# Patient Record
Sex: Female | Born: 1975
Health system: Southern US, Community
[De-identification: ages and names within clinical notes are randomized; demographics above are authoritative.]

## PROBLEM LIST (undated history)

## (undated) ENCOUNTER — Ambulatory Visit (HOSPITAL_BASED_OUTPATIENT_CLINIC_OR_DEPARTMENT_OTHER): Admission: EM | Payer: BC Managed Care – PPO | Source: Home / Self Care

## (undated) DIAGNOSIS — K219 Gastro-esophageal reflux disease without esophagitis: Secondary | ICD-10-CM

## (undated) DIAGNOSIS — E663 Overweight: Secondary | ICD-10-CM

## (undated) DIAGNOSIS — J302 Other seasonal allergic rhinitis: Secondary | ICD-10-CM

## (undated) DIAGNOSIS — R5383 Other fatigue: Secondary | ICD-10-CM

## (undated) DIAGNOSIS — M26629 Arthralgia of temporomandibular joint, unspecified side: Secondary | ICD-10-CM

## (undated) DIAGNOSIS — Z79890 Hormone replacement therapy (postmenopausal): Secondary | ICD-10-CM

## (undated) DIAGNOSIS — R4586 Emotional lability: Secondary | ICD-10-CM

## (undated) DIAGNOSIS — K30 Functional dyspepsia: Secondary | ICD-10-CM

## (undated) DIAGNOSIS — F419 Anxiety disorder, unspecified: Secondary | ICD-10-CM

## (undated) DIAGNOSIS — K5909 Other constipation: Secondary | ICD-10-CM

## (undated) HISTORY — DX: Overweight: E66.3

## (undated) HISTORY — DX: Other fatigue: R53.83

## (undated) HISTORY — DX: Emotional lability: R45.86

## (undated) HISTORY — DX: Gastro-esophageal reflux disease without esophagitis: K21.9

## (undated) HISTORY — DX: Anxiety disorder, unspecified: F41.9

## (undated) HISTORY — DX: Hormone replacement therapy: Z79.890

## (undated) HISTORY — DX: Other constipation: K59.09

## (undated) HISTORY — DX: Other seasonal allergic rhinitis: J30.2

## (undated) HISTORY — DX: Functional dyspepsia: K30

## (undated) HISTORY — DX: Arthralgia of temporomandibular joint, unspecified side: M26.629

## (undated) HISTORY — PX: ABDOMINAL HYSTERECTOMY: SHX81

---

## 1996-06-01 HISTORY — PX: TUBAL LIGATION: SHX77

## 1997-06-01 HISTORY — PX: CYSTECTOMY: SUR359

## 1999-08-20 ENCOUNTER — Encounter: Payer: Self-pay | Admitting: Obstetrics and Gynecology

## 1999-08-20 ENCOUNTER — Encounter: Admission: RE | Admit: 1999-08-20 | Discharge: 1999-08-20 | Payer: Self-pay | Admitting: Obstetrics and Gynecology

## 2002-06-01 HISTORY — PX: CHOLECYSTECTOMY: SHX55

## 2016-06-25 DIAGNOSIS — J0100 Acute maxillary sinusitis, unspecified: Secondary | ICD-10-CM | POA: Diagnosis not present

## 2016-06-29 DIAGNOSIS — J019 Acute sinusitis, unspecified: Secondary | ICD-10-CM | POA: Diagnosis not present

## 2016-06-29 DIAGNOSIS — E663 Overweight: Secondary | ICD-10-CM | POA: Diagnosis not present

## 2016-06-29 DIAGNOSIS — R6889 Other general symptoms and signs: Secondary | ICD-10-CM | POA: Diagnosis not present

## 2016-06-29 DIAGNOSIS — Z6826 Body mass index (BMI) 26.0-26.9, adult: Secondary | ICD-10-CM | POA: Diagnosis not present

## 2016-07-01 DIAGNOSIS — E663 Overweight: Secondary | ICD-10-CM | POA: Diagnosis not present

## 2016-07-01 DIAGNOSIS — Z6826 Body mass index (BMI) 26.0-26.9, adult: Secondary | ICD-10-CM | POA: Diagnosis not present

## 2016-07-01 DIAGNOSIS — J019 Acute sinusitis, unspecified: Secondary | ICD-10-CM | POA: Diagnosis not present

## 2016-07-24 DIAGNOSIS — F411 Generalized anxiety disorder: Secondary | ICD-10-CM | POA: Diagnosis not present

## 2016-07-24 DIAGNOSIS — F3289 Other specified depressive episodes: Secondary | ICD-10-CM | POA: Diagnosis not present

## 2016-07-24 DIAGNOSIS — Z6826 Body mass index (BMI) 26.0-26.9, adult: Secondary | ICD-10-CM | POA: Diagnosis not present

## 2016-07-24 DIAGNOSIS — J302 Other seasonal allergic rhinitis: Secondary | ICD-10-CM | POA: Diagnosis not present

## 2016-07-24 DIAGNOSIS — Z1389 Encounter for screening for other disorder: Secondary | ICD-10-CM | POA: Diagnosis not present

## 2016-08-06 DIAGNOSIS — N939 Abnormal uterine and vaginal bleeding, unspecified: Secondary | ICD-10-CM | POA: Diagnosis not present

## 2016-08-12 DIAGNOSIS — N939 Abnormal uterine and vaginal bleeding, unspecified: Secondary | ICD-10-CM | POA: Diagnosis not present

## 2016-08-12 DIAGNOSIS — Z01419 Encounter for gynecological examination (general) (routine) without abnormal findings: Secondary | ICD-10-CM | POA: Diagnosis not present

## 2016-08-13 DIAGNOSIS — Z01419 Encounter for gynecological examination (general) (routine) without abnormal findings: Secondary | ICD-10-CM | POA: Diagnosis not present

## 2016-08-13 DIAGNOSIS — R875 Abnormal microbiological findings in specimens from female genital organs: Secondary | ICD-10-CM | POA: Diagnosis not present

## 2016-08-27 DIAGNOSIS — N939 Abnormal uterine and vaginal bleeding, unspecified: Secondary | ICD-10-CM | POA: Diagnosis not present

## 2016-09-10 DIAGNOSIS — F411 Generalized anxiety disorder: Secondary | ICD-10-CM | POA: Diagnosis not present

## 2016-09-10 DIAGNOSIS — R1011 Right upper quadrant pain: Secondary | ICD-10-CM | POA: Diagnosis not present

## 2016-09-10 DIAGNOSIS — E663 Overweight: Secondary | ICD-10-CM | POA: Diagnosis not present

## 2016-09-10 DIAGNOSIS — Z6828 Body mass index (BMI) 28.0-28.9, adult: Secondary | ICD-10-CM | POA: Diagnosis not present

## 2016-09-11 DIAGNOSIS — R1011 Right upper quadrant pain: Secondary | ICD-10-CM | POA: Diagnosis not present

## 2016-09-30 DIAGNOSIS — Z6829 Body mass index (BMI) 29.0-29.9, adult: Secondary | ICD-10-CM | POA: Diagnosis not present

## 2016-09-30 DIAGNOSIS — F3289 Other specified depressive episodes: Secondary | ICD-10-CM | POA: Diagnosis not present

## 2016-09-30 DIAGNOSIS — F411 Generalized anxiety disorder: Secondary | ICD-10-CM | POA: Diagnosis not present

## 2016-09-30 DIAGNOSIS — N926 Irregular menstruation, unspecified: Secondary | ICD-10-CM | POA: Diagnosis not present

## 2016-10-15 DIAGNOSIS — N800 Endometriosis of uterus: Secondary | ICD-10-CM | POA: Diagnosis not present

## 2016-10-15 DIAGNOSIS — N939 Abnormal uterine and vaginal bleeding, unspecified: Secondary | ICD-10-CM | POA: Diagnosis not present

## 2016-10-28 DIAGNOSIS — R600 Localized edema: Secondary | ICD-10-CM | POA: Diagnosis not present

## 2016-10-28 DIAGNOSIS — E669 Obesity, unspecified: Secondary | ICD-10-CM | POA: Diagnosis not present

## 2016-10-28 DIAGNOSIS — F411 Generalized anxiety disorder: Secondary | ICD-10-CM | POA: Diagnosis not present

## 2016-10-28 DIAGNOSIS — Z6830 Body mass index (BMI) 30.0-30.9, adult: Secondary | ICD-10-CM | POA: Diagnosis not present

## 2016-10-29 DIAGNOSIS — N939 Abnormal uterine and vaginal bleeding, unspecified: Secondary | ICD-10-CM | POA: Diagnosis not present

## 2016-11-05 DIAGNOSIS — N946 Dysmenorrhea, unspecified: Secondary | ICD-10-CM | POA: Diagnosis not present

## 2016-11-05 DIAGNOSIS — N72 Inflammatory disease of cervix uteri: Secondary | ICD-10-CM | POA: Diagnosis not present

## 2016-11-05 DIAGNOSIS — N800 Endometriosis of uterus: Secondary | ICD-10-CM | POA: Diagnosis not present

## 2016-11-05 DIAGNOSIS — N83291 Other ovarian cyst, right side: Secondary | ICD-10-CM | POA: Diagnosis not present

## 2016-11-05 DIAGNOSIS — D251 Intramural leiomyoma of uterus: Secondary | ICD-10-CM | POA: Diagnosis not present

## 2016-11-05 DIAGNOSIS — N939 Abnormal uterine and vaginal bleeding, unspecified: Secondary | ICD-10-CM | POA: Diagnosis not present

## 2016-11-06 DIAGNOSIS — N800 Endometriosis of uterus: Secondary | ICD-10-CM | POA: Diagnosis not present

## 2016-11-06 DIAGNOSIS — N939 Abnormal uterine and vaginal bleeding, unspecified: Secondary | ICD-10-CM | POA: Diagnosis not present

## 2016-11-06 DIAGNOSIS — N946 Dysmenorrhea, unspecified: Secondary | ICD-10-CM | POA: Diagnosis not present

## 2016-11-06 DIAGNOSIS — D251 Intramural leiomyoma of uterus: Secondary | ICD-10-CM | POA: Diagnosis not present

## 2016-11-06 DIAGNOSIS — N72 Inflammatory disease of cervix uteri: Secondary | ICD-10-CM | POA: Diagnosis not present

## 2016-11-06 DIAGNOSIS — N83291 Other ovarian cyst, right side: Secondary | ICD-10-CM | POA: Diagnosis not present

## 2016-11-09 DIAGNOSIS — R197 Diarrhea, unspecified: Secondary | ICD-10-CM | POA: Diagnosis not present

## 2016-11-09 DIAGNOSIS — R109 Unspecified abdominal pain: Secondary | ICD-10-CM | POA: Diagnosis not present

## 2016-11-09 DIAGNOSIS — G8918 Other acute postprocedural pain: Secondary | ICD-10-CM | POA: Diagnosis not present

## 2016-11-12 DIAGNOSIS — R1030 Lower abdominal pain, unspecified: Secondary | ICD-10-CM | POA: Diagnosis not present

## 2016-11-12 DIAGNOSIS — Z90710 Acquired absence of both cervix and uterus: Secondary | ICD-10-CM | POA: Diagnosis not present

## 2016-11-12 DIAGNOSIS — G8918 Other acute postprocedural pain: Secondary | ICD-10-CM | POA: Diagnosis not present

## 2016-12-30 DIAGNOSIS — Z6832 Body mass index (BMI) 32.0-32.9, adult: Secondary | ICD-10-CM | POA: Diagnosis not present

## 2016-12-30 DIAGNOSIS — J019 Acute sinusitis, unspecified: Secondary | ICD-10-CM | POA: Diagnosis not present

## 2016-12-30 DIAGNOSIS — R635 Abnormal weight gain: Secondary | ICD-10-CM | POA: Diagnosis not present

## 2017-02-03 DIAGNOSIS — E663 Overweight: Secondary | ICD-10-CM | POA: Diagnosis not present

## 2017-02-03 DIAGNOSIS — R635 Abnormal weight gain: Secondary | ICD-10-CM | POA: Diagnosis not present

## 2017-02-03 DIAGNOSIS — J019 Acute sinusitis, unspecified: Secondary | ICD-10-CM | POA: Diagnosis not present

## 2017-02-03 DIAGNOSIS — Z6829 Body mass index (BMI) 29.0-29.9, adult: Secondary | ICD-10-CM | POA: Diagnosis not present

## 2017-02-20 DIAGNOSIS — J019 Acute sinusitis, unspecified: Secondary | ICD-10-CM | POA: Diagnosis not present

## 2017-02-20 DIAGNOSIS — R6884 Jaw pain: Secondary | ICD-10-CM | POA: Diagnosis not present

## 2017-03-03 DIAGNOSIS — R635 Abnormal weight gain: Secondary | ICD-10-CM | POA: Diagnosis not present

## 2017-03-03 DIAGNOSIS — Z6826 Body mass index (BMI) 26.0-26.9, adult: Secondary | ICD-10-CM | POA: Diagnosis not present

## 2017-03-30 DIAGNOSIS — J0100 Acute maxillary sinusitis, unspecified: Secondary | ICD-10-CM | POA: Diagnosis not present

## 2017-03-30 DIAGNOSIS — J209 Acute bronchitis, unspecified: Secondary | ICD-10-CM | POA: Diagnosis not present

## 2017-04-02 DIAGNOSIS — E663 Overweight: Secondary | ICD-10-CM | POA: Diagnosis not present

## 2017-04-02 DIAGNOSIS — R635 Abnormal weight gain: Secondary | ICD-10-CM | POA: Diagnosis not present

## 2017-04-02 DIAGNOSIS — D509 Iron deficiency anemia, unspecified: Secondary | ICD-10-CM | POA: Diagnosis not present

## 2017-04-02 DIAGNOSIS — R0989 Other specified symptoms and signs involving the circulatory and respiratory systems: Secondary | ICD-10-CM | POA: Diagnosis not present

## 2017-07-01 DIAGNOSIS — J0100 Acute maxillary sinusitis, unspecified: Secondary | ICD-10-CM | POA: Diagnosis not present

## 2017-11-04 DIAGNOSIS — J0100 Acute maxillary sinusitis, unspecified: Secondary | ICD-10-CM | POA: Diagnosis not present

## 2017-11-08 DIAGNOSIS — Z6826 Body mass index (BMI) 26.0-26.9, adult: Secondary | ICD-10-CM | POA: Diagnosis not present

## 2017-11-08 DIAGNOSIS — R1011 Right upper quadrant pain: Secondary | ICD-10-CM | POA: Diagnosis not present

## 2017-11-12 DIAGNOSIS — R1011 Right upper quadrant pain: Secondary | ICD-10-CM | POA: Diagnosis not present

## 2017-11-17 DIAGNOSIS — D729 Disorder of white blood cells, unspecified: Secondary | ICD-10-CM | POA: Diagnosis not present

## 2017-11-17 DIAGNOSIS — F411 Generalized anxiety disorder: Secondary | ICD-10-CM | POA: Diagnosis not present

## 2017-11-17 DIAGNOSIS — Z6827 Body mass index (BMI) 27.0-27.9, adult: Secondary | ICD-10-CM | POA: Diagnosis not present

## 2017-12-11 DIAGNOSIS — J309 Allergic rhinitis, unspecified: Secondary | ICD-10-CM | POA: Diagnosis not present

## 2018-02-04 DIAGNOSIS — J019 Acute sinusitis, unspecified: Secondary | ICD-10-CM | POA: Diagnosis not present

## 2018-02-04 DIAGNOSIS — M1710 Unilateral primary osteoarthritis, unspecified knee: Secondary | ICD-10-CM | POA: Diagnosis not present

## 2018-02-04 DIAGNOSIS — Z6825 Body mass index (BMI) 25.0-25.9, adult: Secondary | ICD-10-CM | POA: Diagnosis not present

## 2018-03-07 DIAGNOSIS — J0100 Acute maxillary sinusitis, unspecified: Secondary | ICD-10-CM | POA: Diagnosis not present

## 2018-07-05 DIAGNOSIS — S46911A Strain of unspecified muscle, fascia and tendon at shoulder and upper arm level, right arm, initial encounter: Secondary | ICD-10-CM | POA: Diagnosis not present

## 2018-07-05 DIAGNOSIS — F411 Generalized anxiety disorder: Secondary | ICD-10-CM | POA: Diagnosis not present

## 2018-07-05 DIAGNOSIS — Z6831 Body mass index (BMI) 31.0-31.9, adult: Secondary | ICD-10-CM | POA: Diagnosis not present

## 2018-07-05 DIAGNOSIS — J019 Acute sinusitis, unspecified: Secondary | ICD-10-CM | POA: Diagnosis not present

## 2018-08-11 DIAGNOSIS — L309 Dermatitis, unspecified: Secondary | ICD-10-CM | POA: Diagnosis not present

## 2018-08-11 DIAGNOSIS — J019 Acute sinusitis, unspecified: Secondary | ICD-10-CM | POA: Diagnosis not present

## 2018-08-11 DIAGNOSIS — R0982 Postnasal drip: Secondary | ICD-10-CM | POA: Diagnosis not present

## 2018-08-16 DIAGNOSIS — R0602 Shortness of breath: Secondary | ICD-10-CM | POA: Diagnosis not present

## 2018-08-16 DIAGNOSIS — J019 Acute sinusitis, unspecified: Secondary | ICD-10-CM | POA: Diagnosis not present

## 2018-08-16 DIAGNOSIS — R51 Headache: Secondary | ICD-10-CM | POA: Diagnosis not present

## 2018-10-07 DIAGNOSIS — G4700 Insomnia, unspecified: Secondary | ICD-10-CM | POA: Diagnosis not present

## 2018-10-07 DIAGNOSIS — J019 Acute sinusitis, unspecified: Secondary | ICD-10-CM | POA: Diagnosis not present

## 2018-10-07 DIAGNOSIS — N3000 Acute cystitis without hematuria: Secondary | ICD-10-CM | POA: Diagnosis not present

## 2018-12-03 DIAGNOSIS — Z79899 Other long term (current) drug therapy: Secondary | ICD-10-CM | POA: Diagnosis not present

## 2018-12-03 DIAGNOSIS — R109 Unspecified abdominal pain: Secondary | ICD-10-CM | POA: Diagnosis not present

## 2018-12-03 DIAGNOSIS — N390 Urinary tract infection, site not specified: Secondary | ICD-10-CM | POA: Diagnosis not present

## 2018-12-03 DIAGNOSIS — Z79891 Long term (current) use of opiate analgesic: Secondary | ICD-10-CM | POA: Diagnosis not present

## 2018-12-03 DIAGNOSIS — N3001 Acute cystitis with hematuria: Secondary | ICD-10-CM | POA: Diagnosis not present

## 2018-12-03 DIAGNOSIS — B9689 Other specified bacterial agents as the cause of diseases classified elsewhere: Secondary | ICD-10-CM | POA: Diagnosis not present

## 2018-12-07 DIAGNOSIS — K5909 Other constipation: Secondary | ICD-10-CM | POA: Diagnosis not present

## 2018-12-07 DIAGNOSIS — R635 Abnormal weight gain: Secondary | ICD-10-CM | POA: Diagnosis not present

## 2018-12-07 DIAGNOSIS — Z6830 Body mass index (BMI) 30.0-30.9, adult: Secondary | ICD-10-CM | POA: Diagnosis not present

## 2019-01-11 DIAGNOSIS — R635 Abnormal weight gain: Secondary | ICD-10-CM | POA: Diagnosis not present

## 2019-01-11 DIAGNOSIS — Z6830 Body mass index (BMI) 30.0-30.9, adult: Secondary | ICD-10-CM | POA: Diagnosis not present

## 2019-01-11 DIAGNOSIS — F411 Generalized anxiety disorder: Secondary | ICD-10-CM | POA: Diagnosis not present

## 2019-01-17 DIAGNOSIS — Z20828 Contact with and (suspected) exposure to other viral communicable diseases: Secondary | ICD-10-CM | POA: Diagnosis not present

## 2019-01-25 DIAGNOSIS — L259 Unspecified contact dermatitis, unspecified cause: Secondary | ICD-10-CM | POA: Diagnosis not present

## 2019-01-25 DIAGNOSIS — Z6829 Body mass index (BMI) 29.0-29.9, adult: Secondary | ICD-10-CM | POA: Diagnosis not present

## 2019-01-26 DIAGNOSIS — L259 Unspecified contact dermatitis, unspecified cause: Secondary | ICD-10-CM | POA: Diagnosis not present

## 2019-01-26 DIAGNOSIS — Z6830 Body mass index (BMI) 30.0-30.9, adult: Secondary | ICD-10-CM | POA: Diagnosis not present

## 2019-01-26 DIAGNOSIS — R102 Pelvic and perineal pain: Secondary | ICD-10-CM | POA: Diagnosis not present

## 2019-02-07 DIAGNOSIS — R3989 Other symptoms and signs involving the genitourinary system: Secondary | ICD-10-CM | POA: Diagnosis not present

## 2019-02-07 DIAGNOSIS — Z6830 Body mass index (BMI) 30.0-30.9, adult: Secondary | ICD-10-CM | POA: Diagnosis not present

## 2019-02-07 DIAGNOSIS — R635 Abnormal weight gain: Secondary | ICD-10-CM | POA: Diagnosis not present

## 2019-02-07 DIAGNOSIS — N959 Unspecified menopausal and perimenopausal disorder: Secondary | ICD-10-CM | POA: Diagnosis not present

## 2019-03-09 DIAGNOSIS — R635 Abnormal weight gain: Secondary | ICD-10-CM | POA: Diagnosis not present

## 2019-03-09 DIAGNOSIS — Z6830 Body mass index (BMI) 30.0-30.9, adult: Secondary | ICD-10-CM | POA: Diagnosis not present

## 2019-03-23 DIAGNOSIS — J019 Acute sinusitis, unspecified: Secondary | ICD-10-CM | POA: Diagnosis not present

## 2019-04-03 DIAGNOSIS — Z6830 Body mass index (BMI) 30.0-30.9, adult: Secondary | ICD-10-CM | POA: Diagnosis not present

## 2019-04-03 DIAGNOSIS — N390 Urinary tract infection, site not specified: Secondary | ICD-10-CM | POA: Diagnosis not present

## 2019-04-12 DIAGNOSIS — Z79899 Other long term (current) drug therapy: Secondary | ICD-10-CM | POA: Diagnosis not present

## 2019-04-12 DIAGNOSIS — R1032 Left lower quadrant pain: Secondary | ICD-10-CM | POA: Diagnosis not present

## 2019-04-12 DIAGNOSIS — R3915 Urgency of urination: Secondary | ICD-10-CM | POA: Diagnosis not present

## 2019-04-12 DIAGNOSIS — N390 Urinary tract infection, site not specified: Secondary | ICD-10-CM | POA: Diagnosis not present

## 2019-04-12 DIAGNOSIS — R310 Gross hematuria: Secondary | ICD-10-CM | POA: Diagnosis not present

## 2019-05-12 DIAGNOSIS — J019 Acute sinusitis, unspecified: Secondary | ICD-10-CM | POA: Diagnosis not present

## 2019-05-12 DIAGNOSIS — Z79899 Other long term (current) drug therapy: Secondary | ICD-10-CM | POA: Diagnosis not present

## 2020-11-08 ENCOUNTER — Encounter: Payer: Self-pay | Admitting: Cardiology

## 2020-11-11 ENCOUNTER — Encounter: Payer: Self-pay | Admitting: Cardiology

## 2020-11-11 ENCOUNTER — Ambulatory Visit: Payer: 59 | Admitting: Cardiology

## 2020-11-11 ENCOUNTER — Other Ambulatory Visit: Payer: Self-pay

## 2020-11-11 VITALS — BP 140/92 | HR 88 | Ht 61.0 in | Wt 185.0 lb

## 2020-11-11 DIAGNOSIS — R9431 Abnormal electrocardiogram [ECG] [EKG]: Secondary | ICD-10-CM | POA: Diagnosis not present

## 2020-11-11 DIAGNOSIS — R0602 Shortness of breath: Secondary | ICD-10-CM

## 2020-11-11 DIAGNOSIS — R0789 Other chest pain: Secondary | ICD-10-CM

## 2020-11-11 DIAGNOSIS — R400 Somnolence: Secondary | ICD-10-CM

## 2020-11-11 DIAGNOSIS — R5383 Other fatigue: Secondary | ICD-10-CM | POA: Diagnosis not present

## 2020-11-11 DIAGNOSIS — I872 Venous insufficiency (chronic) (peripheral): Secondary | ICD-10-CM

## 2020-11-11 DIAGNOSIS — R0683 Snoring: Secondary | ICD-10-CM

## 2020-11-11 DIAGNOSIS — Z720 Tobacco use: Secondary | ICD-10-CM

## 2020-11-11 NOTE — Patient Instructions (Addendum)
Medication Instructions:  Your physician recommends that you continue on your current medications as directed. Please refer to the Current Medication list given to you today.  *If you need a refill on your cardiac medications before your next appointment, please call your pharmacy*   Lab Work: None If you have labs (blood work) drawn today and your tests are completely normal, you will receive your results only by: MyChart Message (if you have MyChart) OR A paper copy in the mail If you have any lab test that is abnormal or we need to change your treatment, we will call you to review the results.   Testing/Procedures: Your physician has requested that you have an echocardiogram. Echocardiography is a painless test that uses sound waves to create images of your heart. It provides your doctor with information about the size and shape of your heart and how well your heart's chambers and valves are working. This procedure takes approximately one hour. There are no restrictions for this procedure.  Your physician has requested that you have an exercise stress myoview. For further information please visit [email or web address] Please follow instruction sheet, as given.    Follow-Up: At [facility], you and your health needs are our priority.  As part of our continuing mission to provide you with exceptional heart care, we have created designated Provider Care Teams.  These Care Teams include your primary Cardiologist (physician) and Advanced Practice Providers (APPs -  Physician Assistants and Nurse Practitioners) who all work together to provide you with the care you need, when you need it.  We recommend signing up for the patient portal called "MyChart".  Sign up information is provided on this After Visit Summary.  MyChart is used to connect with patients for Virtual Visits (Telemedicine).  Patients are able to view lab/test results, encounter notes, upcoming appointments, etc.  Non-urgent  messages can be sent to your provider as well.   To learn more about what you can do with MyChart, go to [email or web address].    Your next appointment:   12 week(s)  The format for your next appointment:   In Person  Provider:      Other Instructions   [facility] [address] [place], [place] [address] Phone:  [phone]  Please arrive 15 minutes prior to your appointment time for registration and insurance purposes.  The test will take approximately 3 to 4 hours to complete; you may bring reading material.  If someone comes with you to your appointment, they will need to remain in the main lobby due to limited space in the testing area. **If you are pregnant or breastfeeding, please notify the nuclear lab prior to your appointment**  How to prepare for your Myocardial Perfusion Test: Do not eat or drink 3 hours prior to your test, except you may have water. Do not consume products containing caffeine (regular or decaffeinated) 12 hours prior to your test. (ex: coffee, chocolate, sodas, tea). Do wear comfortable clothes (no dresses or overalls) and walking shoes, tennis shoes preferred (No heels or open toe shoes are allowed). Do NOT wear cologne, perfume, aftershave, or lotions (deodorant is allowed). If these instructions are not followed, your test will have to be rescheduled.  Please report to [address] for your test.  If you have questions or concerns about your appointment, you can call the [facility] at [phone].  If you cannot keep your appointment, please provide 24 hours notification to the  Nuclear Lab, to avoid a possible $50 charge to your account.  Echocardiogram An echocardiogram is a test that uses sound waves (ultrasound) to produce images of the heart. Images from an echocardiogram can provide important information about: Heart size and shape. The size and thickness and movement  of your heart's walls. Heart muscle function and strength. Heart valve function or if you have stenosis. Stenosis is when the heart valves are too narrow. If blood is flowing backward through the heart valves (regurgitation). A tumor or infectious growth around the heart valves. Areas of heart muscle that are not working well because of poor blood flow or injury from a heart attack. Aneurysm detection. An aneurysm is a weak or damaged part of an artery wall. The wall bulges out from the normal force of blood pumping through the body. Tell a health care provider about: Any allergies you have. All medicines you are taking, including vitamins, herbs, eye drops, creams, and over-the-counter medicines. Any blood disorders you have. Any surgeries you have had. Any medical conditions you have. Whether you are pregnant or may be pregnant. What are the risks? Generally, this is a safe test. However, problems may occur, including an allergic reaction to dye (contrast) that may be used during the test. What happens before the test? No specific preparation is needed. You may eat and drink normally. What happens during the test?  You will take off your clothes from the waist up and put on a hospital gown. Electrodes or electrocardiogram (ECG)patches may be placed on your chest. The electrodes or patches are then connected to a device that monitors your heart rate and rhythm. You will lie down on a table for an ultrasound exam. A gel will be applied to your chest to help sound waves pass through your skin. A handheld device, called a transducer, will be pressed against your chest and moved over your heart. The transducer produces sound waves that travel to your heart and bounce back (or "echo" back) to the transducer. These sound waves will be captured in real-time and changed into images of your heart that can be viewed on a video monitor. The images will be recorded on a computer and reviewed by your  health care provider. You may be asked to change positions or hold your breath for a short time. This makes it easier to get different views or better views of your heart. In some cases, you may receive contrast through an IV in one of your veins. This can improve the quality of the pictures from your heart. The procedure may vary among health care providers and hospitals. What can I expect after the test? You may return to your normal, everyday life, including diet, activities, andmedicines, unless your health care provider tells you not to do that. Follow these instructions at home: It is up to you to get the results of your test. Ask your health care provider, or the department that is doing the test, when your results will be ready. Keep all follow-up visits. This is important. Summary An echocardiogram is a test that uses sound waves (ultrasound) to produce images of the heart. Images from an echocardiogram can provide important information about the size and shape of your heart, heart muscle function, heart valve function, and other possible heart problems. You do not need to do anything to prepare before this test. You may eat and drink normally. After the echocardiogram is completed, you may return to your normal, everyday life, unless your health  care provider tells you not to do that. This information is not intended to replace advice given to you by your health care provider. Make sure you discuss any questions you have with your healthcare provider. Document Revised: 01/09/2020 Document Reviewed: 01/09/2020 Elsevier Patient Education  2022 [employer].

## 2020-11-11 NOTE — Progress Notes (Signed)
Cardiology Office Note:    Date:  11/11/2020   ID:  Elizabeth Montoya, DOB 1976-03-24, MRN [id number]  PCP:  Lucianne Lei, MD  Cardiologist:  Thomasene Ripple, DO  Electrophysiologist:  None   Referring MD: Lucianne Lei, MD   No chief complaint on file. " I am having shortness of breath and fatigue"   History of Present Illness:    Elizabeth Montoya is a 45 y.o. female with a hx of obesity, anxiety who is here today to be evaluated for shortness of breath and fatigue.  Patient tells me the last several months she has had significant shortness of breath on exertion.  She notes that with that she is have been having some fatigue.  In the last couple weeks she has had intermittent chest discomfort.  She describes it as a very soft sensation of midsternal chest pain.  She notes that the shortness of breath actually is the biggest problem on her fall that has been coupled with her fatigue She also tells me that only she fatigue symptoms during the day she feels as if she is going to fall asleep and cannot do all of her work. 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  She is concerned   Past Medical History:  Diagnosis Date   Anxiety    Constipation, chronic    Fatigue    Hormone replacement therapy    Mood swings    Overweight    Persistent indigestion    Seasonal allergies    TMJ arthralgia     Past Surgical History:  Procedure Laterality Date   ABDOMINAL HYSTERECTOMY     CESAREAN SECTION  1997   CHOLECYSTECTOMY  2004   CYSTECTOMY  1999   TUBAL LIGATION  1998    Current Medications: No outpatient medications have been marked as taking for the 11/11/20 encounter (Office Visit) with Thomasene Ripple, DO.     Allergies:   Codeine   Social History   Socioeconomic History   Marital status: Single    Spouse name: Not on file   Number of children: Not on file   Years of education: Not on file    Highest education level: Not on file  Occupational History   Not on file  Tobacco Use   Smoking status: Former    Pack years: 0.00    Types: Cigarettes   Smokeless tobacco: Not on file  Substance and Sexual Activity   Alcohol use: Never   Drug use: Never   Sexual activity: Not on file  Other Topics Concern   Not on file  Social History Narrative   Not on file   Social Determinants of Health   Financial Resource Strain: Not on file  Food Insecurity: Not on file  Transportation Needs: Not on file  Physical Activity: Not on file  Stress: Not on file  Social Connections: Not on file     Family History: The patient's family history includes Anemia in her father; Arthritis in her father; Asthma in her father and mother; Cancer in her father; Colon cancer in her paternal grandmother; Heart disease in her father and paternal grandmother; Heart failure in her mother; Hypercholesterolemia in her father and paternal grandmother; Hypertension in her father; Prostate cancer in her paternal grandfather; Seizures in her mother; Stroke in her father; Thyroid disease in her mother.  ROS:   Review of Systems  Constitution: Negative for decreased appetite, fever and weight gain.  HENT: Negative for congestion, ear discharge, hoarse voice and sore throat.   Eyes: Negative for discharge, redness, vision loss in right eye and visual halos.  Cardiovascular: Negative for chest pain, dyspnea on exertion, leg swelling, orthopnea and palpitations.  Respiratory: Negative for cough, hemoptysis, shortness of breath and snoring.   Endocrine: Negative for heat intolerance and polyphagia.  Hematologic/Lymphatic: Negative for bleeding problem. Does not bruise/bleed easily.  Skin: Negative for flushing, nail changes, rash and suspicious lesions.  Musculoskeletal: Negative for arthritis, joint pain, muscle cramps, myalgias, neck pain and stiffness.  Gastrointestinal: Negative for abdominal pain, bowel  incontinence, diarrhea and excessive appetite.  Genitourinary: Negative for decreased libido, genital sores and incomplete emptying.  Neurological: Negative for brief paralysis, focal weakness, headaches and loss of balance.  Psychiatric/Behavioral: Negative for altered mental status, depression and suicidal ideas.  Allergic/Immunologic: Negative for HIV exposure and persistent infections.    EKGs/Labs/Other Studies Reviewed:    The following studies were reviewed today:   EKG:  The ekg ordered today demonstrates sinus rhythm, heart rate 88 bpm with poor progression in the anterior leads suggesting old anterior.  Recent Labs: No results found for requested labs within last 8760 hours.  Recent Lipid Panel No results found for: CHOL, TRIG, HDL, CHOLHDL, VLDL, LDLCALC, LDLDIRECT  Physical Exam:    VS:  BP (!) 140/92 (BP Location: Right Arm)   Pulse 88   Ht 5\' 1"  (1.549 m)   Wt 185 lb (83.9 kg)   SpO2 98%   BMI 34.96 kg/m     Wt Readings from Last 3 Encounters:  11/11/20 185 lb (83.9 kg)  11/07/20 185 lb (83.9 kg)     GEN: Well nourished, well developed in no acute distress HEENT: Normal NECK: No JVD; No carotid bruits LYMPHATICS: No lymphadenopathy CARDIAC: S1S2 noted,RRR, no murmurs, rubs, gallops RESPIRATORY:  Clear to auscultation without rales, wheezing or rhonchi  ABDOMEN: Soft, non-tender, non-distended, +bowel sounds, no guarding. EXTREMITIES: No edema, No cyanosis, no clubbing MUSCULOSKELETAL:  No deformity  SKIN: Warm and dry NEUROLOGIC:  Alert and oriented x 3, non-focal PSYCHIATRIC:  Normal affect, good insight  ASSESSMENT:    1. SOB (shortness of breath)   2. Other chest pain   3. Fatigue, unspecified type   4. Nonspecific abnormal electrocardiogram (ECG) (EKG)   5. Snoring   6. Daytime somnolence   7. Tobacco use   8. Venous insufficiency    PLAN:     Her shortness of breath along with her intermittent chest pain and abnormal EKG is concerning  therefore I will proceed with an ischemic evaluation in this patient.  She is agreeable for exercise nuclear stress test therefore we will proceed with this.  I have educated patient about this test and she is agreeable to proceed.  In the meantime for completeness an echocardiogram will be ordered to assess for LV function and any other structural abnormalities.  I think sleep apnea may be playing a role, I reviewed her PCP notes in her PCP have ordered a sleep study so I encouraged the patient to follow through with this.  The patient understands the need to lose weight with diet and exercise. We have discussed specific strategies for this.  She is hypertensive in the office today going back to her last visit on November 08, 2018 with her PCP at which time she was 128/76.  This is an isolated reading.  Advised the patient to take  her blood pressure every day for the next 2 weeks and send this information to my office.  Smoking cessation advised.  The patient is in agreement with the above plan. The patient left the office in stable condition.  The patient will follow up in 12 weeks or sooner if needed.   Medication Adjustments/Labs and Tests Ordered: Current medicines are reviewed at length with the patient today.  Concerns regarding medicines are outlined above.  Orders Placed This Encounter  Procedures   MYOCARDIAL PERFUSION IMAGING   EKG 12-Lead   ECHOCARDIOGRAM COMPLETE   No orders of the defined types were placed in this encounter.   Patient Instructions  Medication Instructions:  Your physician recommends that you continue on your current medications as directed. Please refer to the Current Medication list given to you today.  *If you need a refill on your cardiac medications before your next appointment, please call your pharmacy*   Lab Work: None If you have labs (blood work) drawn today and your tests are completely normal, you will receive your results only by: MyChart  Message (if you have MyChart) OR A paper copy in the mail If you have any lab test that is abnormal or we need to change your treatment, we will call you to review the results.   Testing/Procedures: Your physician has requested that you have an echocardiogram. Echocardiography is a painless test that uses sound waves to create images of your heart. It provides your doctor with information about the size and shape of your heart and how well your heart's chambers and valves are working. This procedure takes approximately one hour. There are no restrictions for this procedure.  Your physician has requested that you have an exercise stress myoview. For further information please visit [email or web address]. Please follow instruction sheet, as given.    Follow-Up: At [facility], you and your health needs are our priority.  As part of our continuing mission to provide you with exceptional heart care, we have created designated Provider Care Teams.  These Care Teams include your primary Cardiologist (physician) and Advanced Practice Providers (APPs -  Physician Assistants and Nurse Practitioners) who all work together to provide you with the care you need, when you need it.  We recommend signing up for the patient portal called "MyChart".  Sign up information is provided on this After Visit Summary.  MyChart is used to connect with patients for Virtual Visits (Telemedicine).  Patients are able to view lab/test results, encounter notes, upcoming appointments, etc.  Non-urgent messages can be sent to your provider as well.   To learn more about what you can do with MyChart, go to [email or web address].    Your next appointment:   12 week(s)  The format for your next appointment:   In Person  Provider:      Other Instructions   [facility]  Nuclear Imaging [address] [place], [place] [address] Phone:  [phone]  Please arrive 15 minutes prior to your appointment time for  registration and insurance purposes.  The test will take approximately 3 to 4 hours to complete; you may bring reading material.  If someone comes with you to your appointment, they will need to remain in the main lobby due to limited space in the testing area. **If you are pregnant or breastfeeding, please notify the nuclear lab prior to your appointment**  How to prepare for your Myocardial Perfusion Test: Do not eat or drink 3 hours prior to your test, except you may  have water. Do not consume products containing caffeine (regular or decaffeinated) 12 hours prior to your test. (ex: coffee, chocolate, sodas, tea). Do wear comfortable clothes (no dresses or overalls) and walking shoes, tennis shoes preferred (No heels or open toe shoes are allowed). Do NOT wear cologne, perfume, aftershave, or lotions (deodorant is allowed). If these instructions are not followed, your test will have to be rescheduled.  Please report to [address] for your test.  If you have questions or concerns about your appointment, you can call the [facility] Lab at [phone].  If you cannot keep your appointment, please provide 24 hours notification to the Nuclear Lab, to avoid a possible $50 charge to your account.  Echocardiogram An echocardiogram is a test that uses sound waves (ultrasound) to produce images of the heart. Images from an echocardiogram can provide important information about: Heart size and shape. The size and thickness and movement of your heart's walls. Heart muscle function and strength. Heart valve function or if you have stenosis. Stenosis is when the heart valves are too narrow. If blood is flowing backward through the heart valves (regurgitation). A tumor or infectious growth around the heart valves. Areas of heart muscle that are not working well because of poor blood flow or injury from a heart attack. Aneurysm detection. An aneurysm is a weak or  damaged part of an artery wall. The wall bulges out from the normal force of blood pumping through the body. Tell a health care provider about: Any allergies you have. All medicines you are taking, including vitamins, herbs, eye drops, creams, and over-the-counter medicines. Any blood disorders you have. Any surgeries you have had. Any medical conditions you have. Whether you are pregnant or may be pregnant. What are the risks? Generally, this is a safe test. However, problems may occur, including an allergic reaction to dye (contrast) that may be used during the test. What happens before the test? No specific preparation is needed. You may eat and drink normally. What happens during the test?  You will take off your clothes from the waist up and put on a hospital gown. Electrodes or electrocardiogram (ECG)patches may be placed on your chest. The electrodes or patches are then connected to a device that monitors your heart rate and rhythm. You will lie down on a table for an ultrasound exam. A gel will be applied to your chest to help sound waves pass through your skin. A handheld device, called a transducer, will be pressed against your chest and moved over your heart. The transducer produces sound waves that travel to your heart and bounce back (or "echo" back) to the transducer. These sound waves will be captured in real-time and changed into images of your heart that can be viewed on a video monitor. The images will be recorded on a computer and reviewed by your health care provider. You may be asked to change positions or hold your breath for a short time. This makes it easier to get different views or better views of your heart. In some cases, you may receive contrast through an IV in one of your veins. This can improve the quality of the pictures from your heart. The procedure may vary among health care providers and hospitals. What can I expect after the test? You may return to your  normal, everyday life, including diet, activities, andmedicines, unless your health care provider tells you not to do that. Follow these instructions at home: It  is up to you to get the results of your test. Ask your health care provider, or the department that is doing the test, when your results will be ready. Keep all follow-up visits. This is important. Summary An echocardiogram is a test that uses sound waves (ultrasound) to produce images of the heart. Images from an echocardiogram can provide important information about the size and shape of your heart, heart muscle function, heart valve function, and other possible heart problems. You do not need to do anything to prepare before this test. You may eat and drink normally. After the echocardiogram is completed, you may return to your normal, everyday life, unless your health care provider tells you not to do that. This information is not intended to replace advice given to you by your health care provider. Make sure you discuss any questions you have with your healthcare provider. Document Revised: 01/09/2020 Document Reviewed: 01/09/2020 Elsevier Patient Education  2022 Elsevier Inc.     Adopting a Healthy Lifestyle.  Know what a healthy weight is for you (roughly BMI <25) and aim to maintain this   Aim for 7+ servings of fruits and vegetables daily   65-80+ fluid ounces of water or unsweet tea for healthy kidneys   Limit to max 1 drink of alcohol per day; avoid smoking/tobacco   Limit animal fats in diet for cholesterol and heart health - choose grass fed whenever available   Avoid highly processed foods, and foods high in saturated/trans fats   Aim for low stress - take time to unwind and care for your mental health   Aim for 150 min of moderate intensity exercise weekly for heart health, and weights twice weekly for bone health   Aim for 7-9 hours of sleep daily   When it comes to diets, agreement about the perfect plan  isnt easy to find, even among the experts. Experts at the [facility] of [employer] developed an idea known as the Healthy Eating Plate. Just imagine a plate divided into logical, healthy portions.   The emphasis is on diet quality:   Load up on vegetables and fruits - one-half of your plate: Aim for color and variety, and remember that potatoes dont count.   Go for whole grains - one-quarter of your plate: Whole wheat, barley, wheat berries, quinoa, oats, brown rice, and foods made with them. If you want pasta, go with whole wheat pasta.   Protein power - one-quarter of your plate: Fish, chicken, beans, and nuts are all healthy, versatile protein sources. Limit red meat.   The diet, however, does go beyond the plate, offering a few other suggestions.   Use healthy plant oils, such as olive, canola, soy, corn, sunflower and peanut. Check the labels, and avoid partially hydrogenated oil, which have unhealthy trans fats.   If youre thirsty, drink water. Coffee and tea are good in moderation, but skip sugary drinks and limit milk and dairy products to one or two daily servings.   The type of carbohydrate in the diet is more important than the amount. Some sources of carbohydrates, such as vegetables, fruits, whole grains, and beans-are healthier than others.   Finally, stay active  Signed, Thomasene Ripple, DO  11/11/2020 5:02 PM    [facility]

## 2020-11-12 ENCOUNTER — Telehealth (HOSPITAL_COMMUNITY): Payer: Self-pay | Admitting: *Deleted

## 2020-11-12 ENCOUNTER — Ambulatory Visit (INDEPENDENT_AMBULATORY_CARE_PROVIDER_SITE_OTHER): Payer: 59

## 2020-11-12 ENCOUNTER — Encounter: Payer: Self-pay | Admitting: Cardiology

## 2020-11-12 DIAGNOSIS — R5383 Other fatigue: Secondary | ICD-10-CM | POA: Diagnosis not present

## 2020-11-12 DIAGNOSIS — R0602 Shortness of breath: Secondary | ICD-10-CM

## 2020-11-12 LAB — ECHOCARDIOGRAM COMPLETE
Area-P 1/2: 4.02 cm2
S' Lateral: 2.7 cm

## 2020-11-12 NOTE — Progress Notes (Signed)
Complete echocardiogram performed.  Jimmy Calix Heinbaugh RDCS, RVT  

## 2020-11-12 NOTE — Telephone Encounter (Signed)
Patient given detailed instructions per Myocardial Perfusion Study Information Sheet for the test on 11/13/20 at 11:30. Patient notified to arrive 15 minutes early and that it is imperative to arrive on time for appointment to keep from having the test rescheduled.  If you need to cancel or reschedule your appointment, please call the office within 24 hours of your appointment. . Patient verbalized understanding.Elizabeth Montoya S   

## 2020-11-12 NOTE — Telephone Encounter (Signed)
Patient given detailed instructions per Myocardial Perfusion Study Information Sheet for the test on 11/13/20 at 11:30. Patient notified to arrive 15 minutes early and that it is imperative to arrive on time for appointment to keep from having the test rescheduled.  If you need to cancel or reschedule your appointment, please call the office within 24 hours of your appointment. . Patient verbalized understanding.Elizabeth Montoya

## 2020-11-13 ENCOUNTER — Ambulatory Visit (INDEPENDENT_AMBULATORY_CARE_PROVIDER_SITE_OTHER): Payer: 59

## 2020-11-13 ENCOUNTER — Other Ambulatory Visit: Payer: Self-pay

## 2020-11-13 DIAGNOSIS — R0789 Other chest pain: Secondary | ICD-10-CM

## 2020-11-13 LAB — MYOCARDIAL PERFUSION IMAGING
Estimated workload: 8.9 METS
Exercise duration (min): 7 min
Exercise duration (sec): 15 s
LV dias vol: 69 mL (ref 46–106)
LV sys vol: 23 mL
MPHR: 174 {beats}/min
Peak HR: 153 {beats}/min
Percent HR: 87 %
Rest HR: 74 {beats}/min
SDS: 5
SRS: 1
SSS: 6
TID: 0.91

## 2020-11-13 MED ORDER — TECHNETIUM TC 99M TETROFOSMIN IV KIT
11.0000 | PACK | Freq: Once | INTRAVENOUS | Status: AC | PRN
  Administered 2020-11-13: 11 via INTRAVENOUS

## 2020-11-13 MED ORDER — TECHNETIUM TC 99M TETROFOSMIN IV KIT
32.7000 | PACK | Freq: Once | INTRAVENOUS | Status: AC | PRN
  Administered 2020-11-13: 32.7 via INTRAVENOUS

## 2020-11-14 ENCOUNTER — Telehealth: Payer: Self-pay | Admitting: Cardiology

## 2020-11-14 NOTE — Telephone Encounter (Signed)
Patient informed of results.  

## 2020-11-14 NOTE — Telephone Encounter (Signed)
Patient returning call for stress test results. 

## 2020-12-20 ENCOUNTER — Other Ambulatory Visit (HOSPITAL_BASED_OUTPATIENT_CLINIC_OR_DEPARTMENT_OTHER): Payer: Self-pay

## 2020-12-20 DIAGNOSIS — R5383 Other fatigue: Secondary | ICD-10-CM

## 2020-12-20 DIAGNOSIS — G4710 Hypersomnia, unspecified: Secondary | ICD-10-CM

## 2021-01-01 ENCOUNTER — Ambulatory Visit: Payer: 59 | Admitting: Cardiology

## 2021-01-08 ENCOUNTER — Encounter: Payer: Self-pay | Admitting: Pulmonary Disease

## 2021-01-08 ENCOUNTER — Ambulatory Visit (INDEPENDENT_AMBULATORY_CARE_PROVIDER_SITE_OTHER): Payer: 59 | Admitting: Pulmonary Disease

## 2021-01-08 ENCOUNTER — Ambulatory Visit (INDEPENDENT_AMBULATORY_CARE_PROVIDER_SITE_OTHER): Payer: 59

## 2021-01-08 ENCOUNTER — Other Ambulatory Visit: Payer: Self-pay

## 2021-01-08 VITALS — BP 140/72 | HR 85 | Temp 98.2°F | Ht 61.0 in | Wt 185.6 lb

## 2021-01-08 DIAGNOSIS — R0600 Dyspnea, unspecified: Secondary | ICD-10-CM | POA: Diagnosis not present

## 2021-01-08 DIAGNOSIS — R0609 Other forms of dyspnea: Secondary | ICD-10-CM

## 2021-01-08 DIAGNOSIS — R0683 Snoring: Secondary | ICD-10-CM

## 2021-01-08 NOTE — Patient Instructions (Signed)
Chest xray today  Will schedule pulmonary function test and home sleep study  Follow up in 4 weeks with Dr. Craige Cotta or Nurse Practioner

## 2021-01-08 NOTE — Progress Notes (Signed)
[facility]  Chief Complaint  Patient presents with   Consult    Referred for sob, post covid, increased sob in mornings, having issues sleeping, tired    Constitutional:  BP 140/72 (BP Location: Left Arm, Cuff Size: Normal)   Pulse 85   Temp 98.2 F (36.8 C) (Oral)   Ht 5\' 1"  (1.549 m)   Wt 185 lb 9.6 oz (84.2 kg)   SpO2 98%   BMI 35.07 kg/m   Past Medical History:  Anxiety, Constipation, Fatigue, Allergies, TMJ arthralgia, COVID 19 pneumonia in 2020, Depression  Past Surgical History:  Elizabeth Montoya  has a past surgical history that includes Cesarean section (1997); Tubal ligation (1998); Cystectomy (1999); Cholecystectomy (2004); and Abdominal hysterectomy.  Brief Summary:  Elizabeth Montoya is a 45 y.o. female former smoker with dyspnea and snoring.      Subjective:   Elizabeth Montoya was seen by cardiology for dyspnea.  Echo from June was normal except mild diastolic dysfunction.  Stress test was negative.  Elizabeth Montoya has noticed trouble with her breathing since Elizabeth Montoya had COVID pneumonia in 2020.  Elizabeth Montoya was in hospital for 3 months.  Her symptoms got worse in June.  Elizabeth Montoya was out of town for a job and then get fatigued and short of breath.  Elizabeth Montoya thinks Elizabeth Montoya had COVID again but didn't get tested.  Elizabeth Montoya has cough with clear sputum.  Elizabeth Montoya works around July, but doesn't wear a mask.  Elizabeth Montoya can't breath when wearing a mask.  No history of asthma, but her parents both had emphysema.  Elizabeth Montoya quit smoking in 2019.  Elizabeth Montoya gets winded after walking about 1/2 block.  Elizabeth Montoya has pet dog. Elizabeth Montoya is from 2020.    Elizabeth Montoya snores, and wakes up feeling choked.  Elizabeth Montoya stops breathing at night and feels sleepy during the day.  Her Epworth score is 16 out of 24.  Physical Exam:   Appearance - well kempt   ENMT - no sinus tenderness, no oral exudate, no LAN, Mallampati 3 airway, no stridor, scalloped tongue, narrow jaw  Respiratory - equal breath sounds bilaterally, no  wheezing or rales  CV - s1s2 regular rate and rhythm, no murmurs  Ext - no clubbing, no edema  Skin - no rashes  Psych - normal mood and affect   Pulmonary testing:    Chest Imaging:    Sleep Tests:    Cardiac Tests:  Echo 11/12/20 >> EF 60 to 65%, grade 1 DD  Social History:  Elizabeth Montoya  reports that Elizabeth Montoya quit smoking about 3 years ago. Her smoking use included cigarettes. Elizabeth Montoya has never used smokeless tobacco. Elizabeth Montoya reports that Elizabeth Montoya does not drink alcohol and does not use drugs.  Family History:  Her family history includes Anemia in her father; Arthritis in her father; Asthma in her father and mother; Cancer in her father; Colon cancer in her paternal grandmother; Heart disease in her father and paternal grandmother; Heart failure in her mother; Hypercholesterolemia in her father and paternal grandmother; Hypertension in her father; Prostate cancer in her paternal grandfather; Seizures in her mother; Stroke in her father; Thyroid disease in her mother.     Assessment/Plan:   Dyspnea on exertion with history of COVID 19 pneumonia, tobacco abuse and work place 11/14/20 solution exposures. - will arrange for chest xray and pulmonary function test to assess further   Snoring with excessive daytime sleepiness. - will need to arrange for a home sleep study  Obesity. - discussed how weight can impact sleep and risk for sleep disordered breathing - discussed options to assist with weight loss: combination of diet modification, cardiovascular and strength training exercises  Cardiovascular risk. - had an extensive discussion regarding the adverse health consequences related to untreated sleep disordered breathing - specifically discussed the risks for hypertension, coronary artery disease, cardiac dysrhythmias, cerebrovascular disease, and diabetes - lifestyle modification discussed  Safe driving practices. - discussed how sleep disruption can increase risk of accidents,  particularly when driving - safe driving practices were discussed  Therapies for obstructive sleep apnea. - if the sleep study shows significant sleep apnea, then various therapies for treatment were reviewed: CPAP, oral appliance, and surgical interventions   Time Spent Involved in Patient Care on Day of Examination:  47 minutes  Follow up:   Patient Instructions  Chest xray today  Will schedule pulmonary function test and home sleep study  Follow up in 4 weeks with Dr. Craige Cotta or Nurse Practioner  Medication List:   Allergies as of 01/08/2021       Reactions   Codeine         Medication List        Accurate as of January 08, 2021 10:49 AM. If you have any questions, ask your nurse or doctor.          STOP taking these medications    albuterol (2.5 MG/3ML) 0.083% nebulizer solution Commonly known as: PROVENTIL Stopped by: Coralyn Helling, MD   buPROPion 150 MG 24 hr tablet Commonly known as: WELLBUTRIN XL Stopped by: Coralyn Helling, MD   furosemide 20 MG tablet Commonly known as: LASIX Stopped by: Coralyn Helling, MD   traZODone 100 MG tablet Commonly known as: DESYREL Stopped by: Coralyn Helling, MD   venlafaxine XR 150 MG 24 hr capsule Commonly known as: EFFEXOR-XR Stopped by: Coralyn Helling, MD       TAKE these medications    Allergy Relief 10 MG tablet Generic drug: loratadine Take 10 mg by mouth daily.   celecoxib 200 MG capsule Commonly known as: CELEBREX Take 200 mg by mouth as needed for pain.   montelukast 10 MG tablet Commonly known as: SINGULAIR Take 1 tablet by mouth at bedtime.   omeprazole 20 MG capsule Commonly known as: PRILOSEC Take 1 capsule by mouth every morning.        Signature:  Coralyn Helling, MD [facility] Pager - [phone] 01/08/2021, 10:49 AM

## 2021-01-09 ENCOUNTER — Encounter: Payer: Self-pay | Admitting: *Deleted

## 2021-01-27 DIAGNOSIS — J302 Other seasonal allergic rhinitis: Secondary | ICD-10-CM | POA: Insufficient documentation

## 2021-01-27 DIAGNOSIS — R5383 Other fatigue: Secondary | ICD-10-CM | POA: Insufficient documentation

## 2021-01-27 DIAGNOSIS — F419 Anxiety disorder, unspecified: Secondary | ICD-10-CM | POA: Insufficient documentation

## 2021-01-27 DIAGNOSIS — R4586 Emotional lability: Secondary | ICD-10-CM | POA: Insufficient documentation

## 2021-01-27 DIAGNOSIS — E663 Overweight: Secondary | ICD-10-CM | POA: Insufficient documentation

## 2021-01-27 DIAGNOSIS — K5909 Other constipation: Secondary | ICD-10-CM | POA: Insufficient documentation

## 2021-01-27 DIAGNOSIS — M26629 Arthralgia of temporomandibular joint, unspecified side: Secondary | ICD-10-CM | POA: Insufficient documentation

## 2021-01-27 DIAGNOSIS — K30 Functional dyspepsia: Secondary | ICD-10-CM | POA: Insufficient documentation

## 2021-01-27 DIAGNOSIS — Z79890 Hormone replacement therapy (postmenopausal): Secondary | ICD-10-CM | POA: Insufficient documentation

## 2021-01-31 ENCOUNTER — Telehealth: Payer: Self-pay | Admitting: Pulmonary Disease

## 2021-02-04 NOTE — Telephone Encounter (Signed)
error 

## 2021-02-05 ENCOUNTER — Ambulatory Visit (INDEPENDENT_AMBULATORY_CARE_PROVIDER_SITE_OTHER): Payer: 59 | Admitting: Cardiology

## 2021-02-05 ENCOUNTER — Other Ambulatory Visit: Payer: Self-pay

## 2021-02-05 ENCOUNTER — Encounter: Payer: Self-pay | Admitting: Cardiology

## 2021-02-05 VITALS — BP 140/98 | HR 104 | Ht 61.0 in | Wt 178.0 lb

## 2021-02-05 DIAGNOSIS — E669 Obesity, unspecified: Secondary | ICD-10-CM | POA: Diagnosis not present

## 2021-02-05 DIAGNOSIS — I10 Essential (primary) hypertension: Secondary | ICD-10-CM

## 2021-02-05 MED ORDER — PROPRANOLOL HCL 20 MG PO TABS: 20.0000 mg | ORAL_TABLET | Freq: Two times a day (BID) | ORAL | 3 refills | Status: DC

## 2021-02-05 NOTE — Patient Instructions (Addendum)
Medication Instructions:  Your physician has recommended you make the following change in your medication:  START: Propranolol 20 mg twice daily *If you need a refill on your cardiac medications before your next appointment, please call your pharmacy*   Lab Work: None If you have labs (blood work) drawn today and your tests are completely normal, you will receive your results only by: MyChart Message (if you have MyChart) OR A paper copy in the mail If you have any lab test that is abnormal or we need to change your treatment, we will call you to review the results.   Testing/Procedures: None   Follow-Up: At [facility], you and your health needs are our priority.  As part of our continuing mission to provide you with exceptional heart care, we have created designated Provider Care Teams.  These Care Teams include your primary Cardiologist (physician) and Advanced Practice Providers (APPs -  Physician Assistants and Nurse Practitioners) who all work together to provide you with the care you need, when you need it.  We recommend signing up for the patient portal called "MyChart".  Sign up information is provided on this After Visit Summary.  MyChart is used to connect with patients for Virtual Visits (Telemedicine).  Patients are able to view lab/test results, encounter notes, upcoming appointments, etc.  Non-urgent messages can be sent to your provider as well.   To learn more about what you can do with MyChart, go to [email or web address].    Your next appointment:   4 month(s)  The format for your next appointment:   In Person  Provider:   Thomasene Ripple, DO [address], [place], [place] [address]    Other Instructions

## 2021-02-06 DIAGNOSIS — E669 Obesity, unspecified: Secondary | ICD-10-CM | POA: Insufficient documentation

## 2021-02-06 DIAGNOSIS — I10 Essential (primary) hypertension: Secondary | ICD-10-CM

## 2021-02-06 HISTORY — DX: Essential (primary) hypertension: I10

## 2021-02-06 HISTORY — DX: Obesity, unspecified: E66.9

## 2021-02-06 NOTE — Progress Notes (Signed)
Cardiology Office Note:    Date:  02/06/2021   ID:  Orson Slick, DOB 12/09/1975, MRN [id number]  PCP:  Lucianne Lei, MD  Cardiologist:  Thomasene Ripple, DO  Electrophysiologist:  None   Referring MD: Lucianne Lei, MD   " I am feel better, but I quit my job"  History of Present Illness:    Elizabeth Montoya is a 45 y.o. female with a hx of obesity, anxiety here today for follow-up visit.  I first saw the patient on November 11, 2020 at that time she was experiencing shortness of breath with chest discomfort I recommended that she undergo a nuclear stress test and echocardiogram.  During that time she also did have elevated blood pressure which was suspected to possibly be isolated elevated blood pressure reading.  In the interim she was able to get this testing done.  She is here today for follow-up visit.  She tells me that she has been having significant palpitations and recently has been overwhelmed and ended up walking of her job.  She still is stressed as she is dealing with unfinished business with her previous boss.  Past Medical History:  Diagnosis Date   Anxiety    Constipation, chronic    Fatigue    Hormone replacement therapy    Mood swings    Overweight    Persistent indigestion    Seasonal allergies    TMJ arthralgia     Past Surgical History:  Procedure Laterality Date   ABDOMINAL HYSTERECTOMY     CESAREAN SECTION  1997   CHOLECYSTECTOMY  2004   CYSTECTOMY  1999   TUBAL LIGATION  1998    Current Medications: Current Meds  Medication Sig   ALLERGY RELIEF 10 MG tablet Take 10 mg by mouth daily.   buPROPion (WELLBUTRIN XL) 150 MG 24 hr tablet Take 150 mg by mouth 2 (two) times daily.   celecoxib (CELEBREX) 200 MG capsule Take 200 mg by mouth daily.   montelukast (SINGULAIR) 10 MG tablet Take 1 tablet by mouth at bedtime.   omeprazole (PRILOSEC) 20 MG capsule Take 1 capsule by mouth every morning.   propranolol (INDERAL) 20 MG tablet Take 1 tablet (20 mg total) by  mouth 2 (two) times daily.     Allergies:   Codeine   Social History   Socioeconomic History   Marital status: Single    Spouse name: Not on file   Number of children: Not on file   Years of education: Not on file   Highest education level: Not on file  Occupational History   Not on file  Tobacco Use   Smoking status: Former    Types: Cigarettes    Quit date: 06/01/2017    Years since quitting: 3.6   Smokeless tobacco: Never  Substance and Sexual Activity   Alcohol use: Never   Drug use: Never   Sexual activity: Not on file  Other Topics Concern   Not on file  Social History Narrative   Not on file   Social Determinants of Health   Financial Resource Strain: Not on file  Food Insecurity: Not on file  Transportation Needs: Not on file  Physical Activity: Not on file  Stress: Not on file  Social Connections: Not on file     Family History: The patient's family history includes Anemia in her father; Arthritis in her father; Asthma in her father and mother; Cancer in her father; Colon cancer in her paternal grandmother; Heart disease in her father  and paternal grandmother; Heart failure in her mother; Hypercholesterolemia in her father and paternal grandmother; Hypertension in her father; Prostate cancer in her paternal grandfather; Seizures in her mother; Stroke in her father; Thyroid disease in her mother.  ROS:   Review of Systems  Constitution: Negative for decreased appetite, fever and weight gain.  HENT: Negative for congestion, ear discharge, hoarse voice and sore throat.   Eyes: Negative for discharge, redness, vision loss in right eye and visual halos.  Cardiovascular: Negative for chest pain, dyspnea on exertion, leg swelling, orthopnea and palpitations.  Respiratory: Negative for cough, hemoptysis, shortness of breath and snoring.   Endocrine: Negative for heat intolerance and polyphagia.  Hematologic/Lymphatic: Negative for bleeding problem. Does not  bruise/bleed easily.  Skin: Negative for flushing, nail changes, rash and suspicious lesions.  Musculoskeletal: Negative for arthritis, joint pain, muscle cramps, myalgias, neck pain and stiffness.  Gastrointestinal: Negative for abdominal pain, bowel incontinence, diarrhea and excessive appetite.  Genitourinary: Negative for decreased libido, genital sores and incomplete emptying.  Neurological: Negative for brief paralysis, focal weakness, headaches and loss of balance.  Psychiatric/Behavioral: Negative for altered mental status, depression and suicidal ideas.  Allergic/Immunologic: Negative for HIV exposure and persistent infections.    EKGs/Labs/Other Studies Reviewed:    The following studies were reviewed today:   EKG: None today  Pharmacologic Nuclear stress test The left ventricular ejection fraction is hyperdynamic (>65%). Nuclear stress EF: 66%. There was no ST segment deviation noted during stress. The study is normal. This is a low risk study.   TTE 2022 IMPRESSIONS   1. Left ventricular ejection fraction, by estimation, is 60 to 65%. The left ventricle has normal function. The left ventricle has no regional wall motion abnormalities. Left ventricular diastolic parameters are consistent with Grade I diastolic  dysfunction (impaired relaxation).   2. Right ventricular systolic function is normal. The right ventricular size is normal. There is normal pulmonary artery systolic pressure.   3. The mitral valve is normal in structure. No evidence of mitral valve regurgitation. No evidence of mitral stenosis.   4. The aortic valve is normal in structure. Aortic valve regurgitation is not visualized. No aortic stenosis is present.   5. The inferior vena cava is normal in size with greater than 50% respiratory variability, suggesting right atrial pressure of 3 mmHg.  FINDINGS   Left Ventricle: Left ventricular ejection fraction, by estimation, is 60 to 65%. The left ventricle has  normal function. The left ventricle has no regional wall motion abnormalities. The left ventricular internal cavity size was normal in size. There is   no left ventricular hypertrophy. Left ventricular diastolic parameters are consistent with Grade I diastolic dysfunction (impaired relaxation).   Right Ventricle: The right ventricular size is normal. No increase in right ventricular wall thickness. Right ventricular systolic function is  normal. There is normal pulmonary artery systolic pressure. The tricuspid regurgitant velocity is 1.90 m/s, and   with an assumed right atrial pressure of 3 mmHg, the estimated right ventricular systolic pressure is 17.4 mmHg.   Left Atrium: Left atrial size was normal in size.   Right Atrium: Right atrial size was normal in size.   Pericardium: There is no evidence of pericardial effusion.   Mitral Valve: The mitral valve is normal in structure. No evidence of mitral valve regurgitation. No evidence of mitral valve stenosis.   Tricuspid Valve: The tricuspid valve is normal in structure. Tricuspid valve regurgitation is not demonstrated. No evidence of tricuspid  stenosis.  Aortic Valve: The aortic valve is normal in structure. Aortic valve  regurgitation is not visualized. No aortic stenosis is present.   Pulmonic Valve: The pulmonic valve was normal in structure. Pulmonic valve regurgitation is not visualized. No evidence of pulmonic stenosis.   Aorta: Descending aorta was measure at 2.1 cm. The aortic root is normal in size and structure.   Venous: The inferior vena cava is normal in size with greater than 50% respiratory variability, suggesting right atrial pressure of 3 mmHg.   IAS/Shunts: No atrial level shunt detected by color flow Doppler.       Recent Labs: No results found for requested labs within last 8760 hours.  Recent Lipid Panel No results found for: CHOL, TRIG, HDL, CHOLHDL, VLDL, LDLCALC, LDLDIRECT  Physical Exam:    VS:  BP  (!) 140/98 (BP Location: Right Arm, Patient Position: Sitting)   Pulse (!) 104   Ht 5\' 1"  (1.549 m)   Wt 178 lb (80.7 kg)   SpO2 99%   BMI 33.63 kg/m     Wt Readings from Last 3 Encounters:  02/05/21 178 lb (80.7 kg)  01/08/21 185 lb 9.6 oz (84.2 kg)  11/13/20 185 lb (83.9 kg)     GEN: Well nourished, well developed in no acute distress HEENT: Normal NECK: No JVD; No carotid bruits LYMPHATICS: No lymphadenopathy CARDIAC: S1S2 noted,RRR, no murmurs, rubs, gallops RESPIRATORY:  Clear to auscultation without rales, wheezing or rhonchi  ABDOMEN: Soft, non-tender, non-distended, +bowel sounds, no guarding. EXTREMITIES: No edema, No cyanosis, no clubbing MUSCULOSKELETAL:  No deformity  SKIN: Warm and dry NEUROLOGIC:  Alert and oriented x 3, non-focal PSYCHIATRIC:  Normal affect, good insight  ASSESSMENT:    1. Primary hypertension   2. Obesity (BMI 30-39.9)    PLAN:    He is hypertensive again in the office today.  She has had elevated blood pressures on 3 separate occasions.  I explained to the patient that this truly is a diagnosis of hypertension.  She still is apprehensive about starting medication.  She still is experiencing palpitations.  We discussed starting the patient on propanolol 0 mg twice a day which will help with the palpitations especially if anxiety related as well as monitor her blood pressure to see if this is going to help as well. The patient is in agreement with the above plan. The patient left the office in stable condition.  The patient will follow up in 4 months.   Medication Adjustments/Labs and Tests Ordered: Current medicines are reviewed at length with the patient today.  Concerns regarding medicines are outlined above.  No orders of the defined types were placed in this encounter.  Meds ordered this encounter  Medications   propranolol (INDERAL) 20 MG tablet    Sig: Take 1 tablet (20 mg total) by mouth 2 (two) times daily.    Dispense:  180  tablet    Refill:  3    Patient Instructions  Medication Instructions:  Your physician has recommended you make the following change in your medication:  START: Propranolol 20 mg twice daily *If you need a refill on your cardiac medications before your next appointment, please call your pharmacy*   Lab Work: None If you have labs (blood work) drawn today and your tests are completely normal, you will receive your results only by: MyChart Message (if you have MyChart) OR A paper copy in the mail If you have any lab test that is abnormal or we need to change  your treatment, we will call you to review the results.   Testing/Procedures: None   Follow-Up: At [facility], you and your health needs are our priority.  As part of our continuing mission to provide you with exceptional heart care, we have created designated Provider Care Teams.  These Care Teams include your primary Cardiologist (physician) and Advanced Practice Providers (APPs -  Physician Assistants and Nurse Practitioners) who all work together to provide you with the care you need, when you need it.  We recommend signing up for the patient portal called "MyChart".  Sign up information is provided on this After Visit Summary.  MyChart is used to connect with patients for Virtual Visits (Telemedicine).  Patients are able to view lab/test results, encounter notes, upcoming appointments, etc.  Non-urgent messages can be sent to your provider as well.   To learn more about what you can do with MyChart, go to [email or web address].    Your next appointment:   4 month(s)  The format for your next appointment:   In Person  Provider:   Thomasene Ripple, DO [address], [place], [place] [address]    Other Instructions     Adopting a Healthy Lifestyle.  Know what a healthy weight is for you (roughly BMI <25) and aim to maintain this   Aim for 7+ servings of fruits and vegetables daily   65-80+ fluid ounces of  water or unsweet tea for healthy kidneys   Limit to max 1 drink of alcohol per day; avoid smoking/tobacco   Limit animal fats in diet for cholesterol and heart health - choose grass fed whenever available   Avoid highly processed foods, and foods high in saturated/trans fats   Aim for low stress - take time to unwind and care for your mental health   Aim for 150 min of moderate intensity exercise weekly for heart health, and weights twice weekly for bone health   Aim for 7-9 hours of sleep daily   When it comes to diets, agreement about the perfect plan isnt easy to find, even among the experts. Experts at the [facility] [employer] developed an idea known as the Healthy Eating Plate. Just imagine a plate divided into logical, healthy portions.   The emphasis is on diet quality:   Load up on vegetables and fruits - one-half of your plate: Aim for color and variety, and remember that potatoes dont count.   Go for whole grains - one-quarter of your plate: Whole wheat, barley, wheat berries, quinoa, oats, brown rice, and foods made with them. If you want pasta, go with whole wheat pasta.   Protein power - one-quarter of your plate: Fish, chicken, beans, and nuts are all healthy, versatile protein sources. Limit red meat.   The diet, however, does go beyond the plate, offering a few other suggestions.   Use healthy plant oils, such as olive, canola, soy, corn, sunflower and peanut. Check the labels, and avoid partially hydrogenated oil, which have unhealthy trans fats.   If youre thirsty, drink water. Coffee and tea are good in moderation, but skip sugary drinks and limit milk and dairy products to one or two daily servings.   The type of carbohydrate in the diet is more important than the amount. Some sources of carbohydrates, such as vegetables, fruits, whole grains, and beans-are healthier than others.   Finally, stay active  Signed, Thomasene Ripple, DO  02/06/2021 12:58 PM     [facility]

## 2021-02-11 ENCOUNTER — Telehealth: Payer: Self-pay | Admitting: Cardiology

## 2021-02-11 NOTE — Telephone Encounter (Signed)
Called pt she. She wants to know how thickened are her walls? What does it mean to have stiffened walls? "My stress is due to my anxiety, I know but that I can not just change that over night. I looked things up and now I have so many questions. I quit my job and I have been out of work for 3 weeks. Pt was placed on another medications today. If I just think about another job I get stressed out.  I have been very tired, I know I haven't done what I need to do as far exercise and eating right but I still don't know what is wrong."  Patient was encouraged to write down any questions and bring them to their next appointment. Pt does not want to travel to [place].

## 2021-02-11 NOTE — Telephone Encounter (Signed)
pt is wanting to speak w/ Dr. Servando Salina in regards to last appointment, has alot of questions that pcp is not able to answer... please advise

## 2021-02-12 ENCOUNTER — Other Ambulatory Visit: Payer: Self-pay

## 2021-02-12 DIAGNOSIS — N959 Unspecified menopausal and perimenopausal disorder: Secondary | ICD-10-CM | POA: Insufficient documentation

## 2021-02-12 DIAGNOSIS — Z780 Asymptomatic menopausal state: Secondary | ICD-10-CM | POA: Insufficient documentation

## 2021-02-12 HISTORY — DX: Unspecified menopausal and perimenopausal disorder: N95.9

## 2021-02-12 HISTORY — DX: Asymptomatic menopausal state: Z78.0

## 2021-02-14 ENCOUNTER — Other Ambulatory Visit: Payer: Self-pay

## 2021-02-14 ENCOUNTER — Encounter: Payer: Self-pay | Admitting: Cardiology

## 2021-02-14 ENCOUNTER — Ambulatory Visit (INDEPENDENT_AMBULATORY_CARE_PROVIDER_SITE_OTHER): Payer: 59 | Admitting: Cardiology

## 2021-02-14 VITALS — BP 150/78 | HR 82 | Ht 61.0 in | Wt 179.0 lb

## 2021-02-14 DIAGNOSIS — E669 Obesity, unspecified: Secondary | ICD-10-CM | POA: Diagnosis not present

## 2021-02-14 DIAGNOSIS — F419 Anxiety disorder, unspecified: Secondary | ICD-10-CM

## 2021-02-14 DIAGNOSIS — I10 Essential (primary) hypertension: Secondary | ICD-10-CM | POA: Diagnosis not present

## 2021-02-14 DIAGNOSIS — R5383 Other fatigue: Secondary | ICD-10-CM | POA: Diagnosis not present

## 2021-02-14 DIAGNOSIS — E782 Mixed hyperlipidemia: Secondary | ICD-10-CM

## 2021-02-14 HISTORY — DX: Mixed hyperlipidemia: E78.2

## 2021-02-14 NOTE — Patient Instructions (Signed)
Medication Instructions:  Your physician recommends that you continue on your current medications as directed. Please refer to the Current Medication list given to you today.  *If you need a refill on your cardiac medications before your next appointment, please call your pharmacy*   Lab Work: Your physician recommends that you return for lab work in: Within the next few days and while fasting.  CBC, BMP, TSH, Lipids, LFT If you have labs (blood work) drawn today and your tests are completely normal, you will receive your results only by: MyChart Message (if you have MyChart) OR A paper copy in the mail If you have any lab test that is abnormal or we need to change your treatment, we will call you to review the results.   Testing/Procedures: We have placed the order for you to have a CT calcium score completed. They will call you to schedule this appointment.    Follow-Up: At [facility], you and your health needs are our priority.  As part of our continuing mission to provide you with exceptional heart care, we have created designated Provider Care Teams.  These Care Teams include your primary Cardiologist (physician) and Advanced Practice Providers (APPs -  Physician Assistants and Nurse Practitioners) who all work together to provide you with the care you need, when you need it.  We recommend signing up for the patient portal called "MyChart".  Sign up information is provided on this After Visit Summary.  MyChart is used to connect with patients for Virtual Visits (Telemedicine).  Patients are able to view lab/test results, encounter notes, upcoming appointments, etc.  Non-urgent messages can be sent to your provider as well.   To learn more about what you can do with MyChart, go to [email or web address].    Your next appointment:   6 month(s)  The format for your next appointment:   In Person  Provider:   Belva Crome, MD   Other Instructions

## 2021-02-14 NOTE — Progress Notes (Signed)
Cardiology Office Note:    Date:  02/14/2021   ID:  Elizabeth Montoya, DOB 12-Jun-1975, MRN [id number]  PCP:  Lucianne Lei, MD  Cardiologist:  Garwin Brothers, MD   Referring MD: Lucianne Lei, MD    ASSESSMENT:    1. Primary hypertension   2. Fatigue, unspecified type   3. Obesity (BMI 30-39.9)   4. Anxiety   5. Mixed dyslipidemia    PLAN:    In order of problems listed above:  Primary prevention stressed with the patient.  Importance of compliance with diet medication stressed and she vocalized understanding.  I reassured her about the findings of the below mentioned testing. Essential hypertension: Blood pressure stable and lifestyle modification was urged.  She has an element of whitecoat hypertension.  Relaxation techniques were discussed.  I told her to walk at least half an hour a day 5 days a week and she promises to do so. Mixed dyslipidemia: Lipids markedly elevated in the month of January this year.  I told her to come back next week for complete blood work including fasting lipids.  She requests vitamin D and we will check that also.  We will send a copy of these reports to her primary care.  For risk stratification and to address her concerns about coronary artery disease and risk stratification we will do a calcium scoring CT scan and she is agreeable. Patient had multiple questions which were answered to her satisfaction.Patient will be seen in follow-up appointment in 6 months or earlier if the patient has any concerns    Medication Adjustments/Labs and Tests Ordered: Current medicines are reviewed at length with the patient today.  Concerns regarding medicines are outlined above.  Orders Placed This Encounter  Procedures   CT CARDIAC SCORING (SELF PAY ONLY)   Basic metabolic panel   CBC   TSH   Lipid panel   Hepatic function panel   No orders of the defined types were placed in this encounter.    No chief complaint on file.    History of Present Illness:     Elizabeth Montoya is a 45 y.o. female.  Patient has past medical history of essential hypertension mixed dyslipidemia and obesity.  She leads a sedentary lifestyle.  She has significant degree of anxiety.  She denies any chest pain orthopnea or PND.  Only concern is that she had a abnormal relaxation of the left ventricle on echocardiogram.  She also worries about coronary artery disease.  Her echocardiogram and stress test was unremarkable and the details are mentioned below.  She appears anxious about all these issues today.  At the time of my evaluation, the patient is alert awake oriented and in no distress.  Past Medical History:  Diagnosis Date   Anxiety    Constipation, chronic    Fatigue    Hormone replacement therapy    Menopausal and postmenopausal disorder 02/12/2021   Menopause 02/12/2021   Mood swings    Obesity (BMI 30-39.9) 02/06/2021   Overweight    Persistent indigestion    Primary hypertension 02/06/2021   Seasonal allergies    TMJ arthralgia     Past Surgical History:  Procedure Laterality Date   ABDOMINAL HYSTERECTOMY     CESAREAN SECTION  1997   CHOLECYSTECTOMY  2004   CYSTECTOMY  1999   TUBAL LIGATION  1998    Current Medications: Current Meds  Medication Sig   ALLERGY RELIEF 10 MG tablet Take 10 mg by mouth daily.  buPROPion (WELLBUTRIN XL) 150 MG 24 hr tablet Take 150 mg by mouth 2 (two) times daily.   celecoxib (CELEBREX) 200 MG capsule Take 200 mg by mouth daily.   montelukast (SINGULAIR) 10 MG tablet Take 1 tablet by mouth at bedtime.   omeprazole (PRILOSEC) 20 MG capsule Take 1 capsule by mouth every morning.   phentermine (ADIPEX-P) 37.5 MG tablet Take 37.5 mg by mouth daily.   propranolol (INDERAL) 20 MG tablet Take 1 tablet (20 mg total) by mouth 2 (two) times daily.   WEEKLY-D 1.25 MG (50000 UT) capsule Take 50,000 Units by mouth every 30 (thirty) days.     Allergies:   Codeine   Social History   Socioeconomic History   Marital status:  Single    Spouse name: Not on file   Number of children: Not on file   Years of education: Not on file   Highest education level: Not on file  Occupational History   Not on file  Tobacco Use   Smoking status: Former    Types: Cigarettes    Quit date: 06/01/2017    Years since quitting: 3.7   Smokeless tobacco: Never  Substance and Sexual Activity   Alcohol use: Never   Drug use: Never   Sexual activity: Not on file  Other Topics Concern   Not on file  Social History Narrative   Not on file   Social Determinants of Health   Financial Resource Strain: Not on file  Food Insecurity: Not on file  Transportation Needs: Not on file  Physical Activity: Not on file  Stress: Not on file  Social Connections: Not on file     Family History: The patient's family history includes Anemia in her father; Arthritis in her father; Asthma in her father and mother; Cancer in her father; Colon cancer in her paternal grandmother; Heart disease in her father and paternal grandmother; Heart failure in her mother; Hypercholesterolemia in her father and paternal grandmother; Hypertension in her father; Prostate cancer in her paternal grandfather; Seizures in her mother; Stroke in her father; Thyroid disease in her mother.  ROS:   Please see the history of present illness.    All other systems reviewed and are negative.  EKGs/Labs/Other Studies Reviewed:    The following studies were reviewed today: IMPRESSIONS     1. Left ventricular ejection fraction, by estimation, is 60 to 65%. The  left ventricle has normal function. The left ventricle has no regional  wall motion abnormalities. Left ventricular diastolic parameters are  consistent with Grade I diastolic  dysfunction (impaired relaxation).   2. Right ventricular systolic function is normal. The right ventricular  size is normal. There is normal pulmonary artery systolic pressure.   3. The mitral valve is normal in structure. No evidence of  mitral valve  regurgitation. No evidence of mitral stenosis.   4. The aortic valve is normal in structure. Aortic valve regurgitation is  not visualized. No aortic stenosis is present.   5. The inferior vena cava is normal in size with greater than 50%  respiratory variability, suggesting right atrial pressure of 3 mmHg.    Study Highlights  The left ventricular ejection fraction is hyperdynamic (>65%). Nuclear stress EF: 66%. There was no ST segment deviation noted during stress. The study is normal. This is a low risk study.       Recent Labs: No results found for requested labs within last 8760 hours.  Recent Lipid Panel No results found for: CHOL,  TRIG, HDL, CHOLHDL, VLDL, LDLCALC, LDLDIRECT  Physical Exam:    VS:  BP (!) 150/78   Pulse 82   Ht 5\' 1"  (1.549 m)   Wt 179 lb (81.2 kg)   SpO2 99%   BMI 33.82 kg/m     Wt Readings from Last 3 Encounters:  02/14/21 179 lb (81.2 kg)  02/05/21 178 lb (80.7 kg)  01/08/21 185 lb 9.6 oz (84.2 kg)     GEN: Patient is in no acute distress HEENT: Normal NECK: No JVD; No carotid bruits LYMPHATICS: No lymphadenopathy CARDIAC: Hear sounds regular, 2/6 systolic murmur at the apex. RESPIRATORY:  Clear to auscultation without rales, wheezing or rhonchi  ABDOMEN: Soft, non-tender, non-distended MUSCULOSKELETAL:  No edema; No deformity  SKIN: Warm and dry NEUROLOGIC:  Alert and oriented x 3 PSYCHIATRIC:  Normal affect   Signed, 03/10/21, MD  02/14/2021 11:58 AM    [facility]

## 2021-02-21 ENCOUNTER — Ambulatory Visit (INDEPENDENT_AMBULATORY_CARE_PROVIDER_SITE_OTHER): Payer: 59 | Admitting: Pulmonary Disease

## 2021-02-21 ENCOUNTER — Other Ambulatory Visit: Payer: Self-pay

## 2021-02-21 ENCOUNTER — Ambulatory Visit: Payer: 59 | Admitting: Cardiology

## 2021-02-21 ENCOUNTER — Encounter: Payer: Self-pay | Admitting: Pulmonary Disease

## 2021-02-21 VITALS — BP 130/82 | HR 78 | Temp 97.6°F | Ht 61.0 in | Wt 178.2 lb

## 2021-02-21 DIAGNOSIS — R0600 Dyspnea, unspecified: Secondary | ICD-10-CM

## 2021-02-21 DIAGNOSIS — R0609 Other forms of dyspnea: Secondary | ICD-10-CM

## 2021-02-21 DIAGNOSIS — R0683 Snoring: Secondary | ICD-10-CM | POA: Diagnosis not present

## 2021-02-21 NOTE — Progress Notes (Signed)
PFT done today. 

## 2021-02-21 NOTE — Progress Notes (Signed)
[facility]  Chief Complaint  Patient presents with   Follow-up    PFT review.      Constitutional:  BP 130/82 (BP Location: Left Arm, Patient Position: Sitting, Cuff Size: Normal)   Pulse 78   Temp 97.6 F (36.4 C) (Oral)   Ht 5\' 1"  (1.549 m)   Wt 178 lb 3.2 oz (80.8 kg)   SpO2 98%   BMI 33.67 kg/m   Past Medical History:  Anxiety, Constipation, Fatigue, Allergies, TMJ arthralgia, COVID 19 pneumonia in 2020, Depression  Past Surgical History:  She  has a past surgical history that includes Cesarean section (1997); Tubal ligation (1998); Cystectomy (1999); Cholecystectomy (2004); and Abdominal hysterectomy.  Brief Summary:  Elizabeth Montoya is a 45 y.o. female former smoker with dyspnea and snoring.      Subjective:   Chest xray from 01/09/21 was normal.  Home sleep study not done yet.  PFT today was normal.  She was seen by cardiology and advised to get cardiac CT scan scheduled.  Breathing symptoms about the same.  She gets winded when walking.  Recovers after few minutes of rest.  Not having cough, wheeze, sputum.  Has tried albuterol before.  Didn't improve her breathing, but made her heart race.  Physical Exam:   Appearance - well kempt   ENMT - no sinus tenderness, no oral exudate, no LAN, Mallampati 3 airway, no stridor  Respiratory - equal breath sounds bilaterally, no wheezing or rales  CV - s1s2 regular rate and rhythm, no murmurs  Ext - no clubbing, no edema  Skin - no rashes  Psych - normal mood and affect    Pulmonary testing:  PFT 02/21/21 >> FEV1 2.75 (105%), FEV1% 83, TLC 4.95 (107%), DLCO 112%  Chest Imaging:    Sleep Tests:    Cardiac Tests:  Echo 11/12/20 >> EF 60 to 65%, grade 1 DD  Social History:  She  reports that she quit smoking about 3 years ago. Her smoking use included cigarettes. She has never used smokeless tobacco. She reports that she does not drink alcohol and does not use  drugs.  Family History:  Her family history includes Anemia in her father; Arthritis in her father; Asthma in her father and mother; Cancer in her father; Colon cancer in her paternal grandmother; Heart disease in her father and paternal grandmother; Heart failure in her mother; Hypercholesterolemia in her father and paternal grandmother; Hypertension in her father; Prostate cancer in her paternal grandfather; Seizures in her mother; Stroke in her father; Thyroid disease in her mother.     Assessment/Plan:   Dyspnea on exertion with history of COVID 19 pneumonia, tobacco abuse and work place 11/14/20 solution exposures. - assessment unrevealing to date for pulmonary cause - discussed her Echo findings for grade 1 diastolic dysfunction - she is to get cardiac CT scan through cardiology - deconditioning likely contributing, and she would benefit from a gradual exercise regimen  Snoring with excessive daytime sleepiness. - will call her with results after home sleep study completed   Time Spent Involved in Patient Care on Day of Examination:  32 minutes  Follow up:   Patient Instructions  Will check on status of getting home sleep study schedule and call for follow up once this is completed  Medication List:   Allergies as of 02/21/2021       Reactions   Codeine Other (See Comments)        Medication  List        Accurate as of February 21, 2021 11:38 AM. If you have any questions, ask your nurse or doctor.          Allergy Relief 10 MG tablet Generic drug: loratadine Take 10 mg by mouth daily.   buPROPion 150 MG 24 hr tablet Commonly known as: WELLBUTRIN XL Take 150 mg by mouth 2 (two) times daily.   celecoxib 200 MG capsule Commonly known as: CELEBREX Take 200 mg by mouth daily.   montelukast 10 MG tablet Commonly known as: SINGULAIR Take 1 tablet by mouth at bedtime.   omeprazole 20 MG capsule Commonly known as: PRILOSEC Take 1 capsule by mouth  every morning.   phentermine 37.5 MG tablet Commonly known as: ADIPEX-P Take 37.5 mg by mouth daily.   propranolol 20 MG tablet Commonly known as: INDERAL Take 1 tablet (20 mg total) by mouth 2 (two) times daily.   Weekly-D 1.25 MG (50000 UT) capsule Generic drug: Cholecalciferol Take 50,000 Units by mouth every 30 (thirty) days.        Signature:  Coralyn Helling, MD [facility] Pager - [phone] 02/21/2021, 11:38 AM

## 2021-02-21 NOTE — Patient Instructions (Signed)
Will check on status of getting home sleep study schedule and call for follow up once this is completed

## 2021-02-24 ENCOUNTER — Telehealth: Payer: Self-pay | Admitting: Cardiology

## 2021-02-24 LAB — PULMONARY FUNCTION TEST
DL/VA % pred: 108 %
DL/VA: 4.82 ml/min/mmHg/L
DLCO cor % pred: 112 %
DLCO cor: 21.63 ml/min/mmHg
DLCO unc % pred: 112 %
DLCO unc: 21.63 ml/min/mmHg
FEF 25-75 Post: 2.89 L/sec
FEF 25-75 Pre: 2.2 L/sec
FEF2575-%Change-Post: 31 %
FEF2575-%Pred-Post: 105 %
FEF2575-%Pred-Pre: 80 %
FEV1-%Change-Post: 9 %
FEV1-%Pred-Post: 105 %
FEV1-%Pred-Pre: 96 %
FEV1-Post: 2.75 L
FEV1-Pre: 2.52 L
FEV1FVC-%Change-Post: 5 %
FEV1FVC-%Pred-Pre: 97 %
FEV6-%Change-Post: 3 %
FEV6-%Pred-Post: 104 %
FEV6-%Pred-Pre: 100 %
FEV6-Post: 3.32 L
FEV6-Pre: 3.21 L
FEV6FVC-%Pred-Post: 102 %
FEV6FVC-%Pred-Pre: 102 %
FVC-%Change-Post: 3 %
FVC-%Pred-Post: 102 %
FVC-%Pred-Pre: 98 %
FVC-Post: 3.32 L
Post FEV1/FVC ratio: 83 %
Post FEV6/FVC ratio: 100 %
Pre FEV1/FVC ratio: 79 %
Pre FEV6/FVC Ratio: 100 %
RV % pred: 123 %
RV: 1.92 L
TLC % pred: 107 %
TLC: 4.95 L

## 2021-02-24 NOTE — Telephone Encounter (Signed)
  Pt c/o medication issue:  1. Name of Medication: propranolol (INDERAL) 20 MG tablet  2. How are you currently taking this medication (dosage and times per day)?   Take 1 tablet (20 mg total) by mouth 2 (two) times daily.  3. Are you having a reaction (difficulty breathing--STAT)?  Really severe headaches, nausea, lightheadedness throughout the day, tired, falls asleep  4. What is your medication issue? Patient states the headaches started after starting the medication

## 2021-02-25 NOTE — Telephone Encounter (Signed)
Pt does not have any BP readings. Pt states that today she has not had a headache or nausea. Pt advised to keep a log of her BP 1-2 hours after her medication. Pt also states that she has noted swelling in her legs. Pt will call back with readings later this week.

## 2021-02-28 ENCOUNTER — Ambulatory Visit: Payer: 59 | Admitting: Cardiology

## 2021-02-28 ENCOUNTER — Ambulatory Visit (HOSPITAL_BASED_OUTPATIENT_CLINIC_OR_DEPARTMENT_OTHER): Payer: 59

## 2021-03-03 MED ORDER — AMLODIPINE BESYLATE 5 MG PO TABS: 5.0000 mg | ORAL_TABLET | Freq: Every day | ORAL | 3 refills | Status: DC

## 2021-03-03 NOTE — Addendum Note (Signed)
Addended by: Eleonore Chiquito on: 03/03/2021 03:00 PM   Modules accepted: Orders

## 2021-03-03 NOTE — Telephone Encounter (Signed)
Spoke with pt who has only checked her BP when she has a headache. Pt states that her diastolic has been in the 90's. I once again explained the importance of checking her blood pressure 1-2 hours after the medication. Pt is also taking Phentermine.

## 2021-03-03 NOTE — Telephone Encounter (Signed)
Recommendation to dc Phentermine reviewed with pt as per Dr. Kem Parkinson note.  Pt verbalized understanding and had no additional questions.

## 2021-03-03 NOTE — Telephone Encounter (Signed)
Pt c/o BP issue: STAT if pt c/o blurred vision, one-sided weakness or slurred speech  1. What are your last 5 BP readings?  143/99  2. Are you having any other symptoms (ex. Dizziness, headache, blurred vision, passed out)?  Nausea   3. What is your BP issue?   Patient is following up, requesting to speak with Ladonna Snide, RN. She states her diastolic BP remained high even after taking her medication.

## 2021-03-06 ENCOUNTER — Ambulatory Visit (HOSPITAL_BASED_OUTPATIENT_CLINIC_OR_DEPARTMENT_OTHER): Payer: 59

## 2021-03-10 ENCOUNTER — Telehealth: Payer: Self-pay | Admitting: Cardiology

## 2021-03-10 NOTE — Telephone Encounter (Signed)
Pt c/o BP issue: STAT if pt c/o blurred vision, one-sided weakness or slurred speech  1. What are your last 5 BP readings? 145/95: 149/98  2. Are you having any other symptoms (ex. Dizziness, headache, blurred vision, passed out)? Fatigue, patient gets to a point where she feels confuse  3. What is your BP issue? high

## 2021-03-11 MED ORDER — AMLODIPINE BESYLATE 5 MG PO TABS: 7.5000 mg | ORAL_TABLET | Freq: Every day | ORAL | 3 refills | Status: DC

## 2021-03-11 NOTE — Telephone Encounter (Signed)
Pt states that she is still having high blood pressure even with the addition of Amlodipine and stopping Phentermine. Pt states that her BP yesterday 1 hour after her medication was 145/95 and 148/98. Pt states that her feeling tired has gotten worse since June. These are all of the readings that the pt has.

## 2021-03-11 NOTE — Telephone Encounter (Signed)
Amlodipine 5 mg daily. She does not have any HR recorded.

## 2021-03-11 NOTE — Telephone Encounter (Signed)
No voicemail set up 

## 2021-03-11 NOTE — Telephone Encounter (Signed)
Recommendations reviewed with pt as per Dr. Revankar's note.  Pt verbalized understanding and had no additional questions.   

## 2021-03-11 NOTE — Addendum Note (Signed)
Addended by: Eleonore Chiquito on: 03/11/2021 03:00 PM   Modules accepted: Orders

## 2021-03-15 LAB — BASIC METABOLIC PANEL
BUN/Creatinine Ratio: 20 (ref 9–23)
BUN: 16 mg/dL (ref 6–24)
CO2: 23 mmol/L (ref 20–29)
Calcium: 9.6 mg/dL (ref 8.7–10.2)
Chloride: 102 mmol/L (ref 96–106)
Creatinine, Ser: 0.81 mg/dL (ref 0.57–1.00)
Glucose: 101 mg/dL — ABNORMAL HIGH (ref 70–99)
Potassium: 4.3 mmol/L (ref 3.5–5.2)
Sodium: 138 mmol/L (ref 134–144)
eGFR: 91 mL/min/{1.73_m2} (ref >59)

## 2021-03-15 LAB — CBC
Hematocrit: 41.7 % (ref 34.0–46.6)
Hemoglobin: 14.7 g/dL (ref 11.1–15.9)
MCH: 31.1 pg (ref 26.6–33.0)
MCHC: 35.3 g/dL (ref 31.5–35.7)
MCV: 88 fL (ref 79–97)
Platelets: 339 10*3/uL (ref 150–450)
RBC: 4.73 x10E6/uL (ref 3.77–5.28)
RDW: 12.9 % (ref 11.7–15.4)
WBC: 7.4 10*3/uL (ref 3.4–10.8)

## 2021-03-15 LAB — HEPATIC FUNCTION PANEL
ALT: 38 IU/L — ABNORMAL HIGH (ref 0–32)
AST: 27 IU/L (ref 0–40)
Albumin: 4.5 g/dL (ref 3.8–4.8)
Alkaline Phosphatase: 62 IU/L (ref 44–121)
Bilirubin Total: 0.3 mg/dL (ref 0.0–1.2)
Bilirubin, Direct: <0.1 mg/dL (ref 0.00–0.40)
Total Protein: 6.8 g/dL (ref 6.0–8.5)

## 2021-03-15 LAB — LIPID PANEL
Chol/HDL Ratio: 5.7 ratio — ABNORMAL HIGH (ref 0.0–4.4)
Cholesterol, Total: 245 mg/dL — ABNORMAL HIGH (ref 100–199)
HDL: 43 mg/dL (ref >39)
LDL Chol Calc (NIH): 173 mg/dL — ABNORMAL HIGH (ref 0–99)
Triglycerides: 156 mg/dL — ABNORMAL HIGH (ref 0–149)
VLDL Cholesterol Cal: 29 mg/dL (ref 5–40)

## 2021-03-15 LAB — TSH: TSH: 1.58 u[IU]/mL (ref 0.450–4.500)

## 2021-03-20 MED ORDER — ROSUVASTATIN CALCIUM 10 MG PO TABS: 10.0000 mg | ORAL_TABLET | Freq: Every day | ORAL | 3 refills | Status: DC

## 2021-03-20 NOTE — Addendum Note (Signed)
Addended by: Eleonore Chiquito on: 03/20/2021 12:51 PM   Modules accepted: Orders

## 2021-03-21 ENCOUNTER — Telehealth: Payer: Self-pay

## 2021-03-21 DIAGNOSIS — E782 Mixed hyperlipidemia: Secondary | ICD-10-CM

## 2021-03-21 NOTE — Telephone Encounter (Signed)
Spoke with patient regarding results and recommendation.  Patient verbalizes understanding and is agreeable to plan of care. Advised patient to call back with any issues or concerns.  

## 2021-03-21 NOTE — Telephone Encounter (Signed)
-----   Message from Garwin Brothers, MD sent at 03/20/2021  7:39 AM EDT ----- Crestor 10mg  and ll in 6wks. Very high cholesterol. Cc pcp , MD 03/20/2021 7:38 AM

## 2021-04-11 ENCOUNTER — Other Ambulatory Visit: Payer: Self-pay

## 2021-04-11 ENCOUNTER — Ambulatory Visit: Payer: 59

## 2021-04-11 DIAGNOSIS — G4733 Obstructive sleep apnea (adult) (pediatric): Secondary | ICD-10-CM | POA: Diagnosis not present

## 2021-04-11 DIAGNOSIS — R0683 Snoring: Secondary | ICD-10-CM

## 2021-04-14 ENCOUNTER — Telehealth: Payer: Self-pay | Admitting: Pulmonary Disease

## 2021-04-14 DIAGNOSIS — G4733 Obstructive sleep apnea (adult) (pediatric): Secondary | ICD-10-CM | POA: Diagnosis not present

## 2021-04-14 NOTE — Telephone Encounter (Signed)
HST 04/14/21 >> 5.8, SpO2 low 89%   Please inform her that her sleep study shows mild obstructive sleep apnea.  Please arrange for ROV with me or NP to discuss treatment options.

## 2021-04-15 NOTE — Telephone Encounter (Signed)
Called and spoke with patient to let her know of results from Dr. Craige Cotta and get her scheduled for OV for follow up. She expressed understanding to results and is now scheduled for follow up. Nothing further needed at this time.  Next Appt With Pulmonology Ruby Cola Cobb, NP)04/17/2021 at  9:00 AM

## 2021-04-16 NOTE — Progress Notes (Signed)
@Patient  ID: Elizabeth Montoya, female    DOB: 1975/08/03, 45 y.o.   MRN: [id number]  Chief Complaint  Patient presents with   Follow-up    Results of HST.    Referring provider: Nicholos Johns, MD  HPI: 45 year old female, former smoker (1 pack years) followed for dyspnea upon exertion and snoring. She is followed by Dr. Halford Chessman and last seen in office 02/21/2021. She completed a HST on 04/14/2021 that showed mild obstructive sleep apnea.    TEST/EVENTS:  11/12/2020 Echocardiogram: EF 60-65% with grade 1 diastolic dysfunction. No valvular abnormalities.  01/09/2021 Chest xray: Normal with no acute cardiopulmonary findings 02/21/2021 PFTs: FEV1 pre 2.52L (96%), FEV1 post 105%, FVC pre 98%, FVC post 3.32 L and 102%, FEV1/FVC ratio pre 79% with post 83%, DLCO 112%  01/08/2021: OV with Dr. Halford Chessman. Referred for SOB post COVID pneumonia with increased SOB in AM, productive cough with clear sputum, and difficulties sleeping with daytime fatigue symptoms. Epworth 16/24. Followed by cardiology and previous echo normal except for mild diastolic dysfunction. Stress test negative. CXR (see above) and PFTs (above) ordered. HST ordered.   02/21/2021: OV with Dr. Halford Chessman to review PFTs. Cardiology advised cardiac CT scan. Breathing symptoms the same as previous. Assessment unrevealing to date for pulmonary cause. Advised deconditioning could be contributing - gradual exercise encouraged. Awaiting HST.    04/16/2021: Today - HST f/u Patient presents today for HST follow up and to discuss possible next steps. Her HST showed an AHI of 5.8 and SpO2 low of 89% indicating mild obstructive sleep apnea. She has continued daytime fatigue symptoms, which improved with hormone therapy and phentermine. She denies narcolepsy or drowsy driving. She reports continued but improved episodes of dyspnea upon exertion and her cough is minimal. She denies wheezing, chest pain or tightness, dizziness or lightheadedness. She plans to follow  up with cardiology and has a Cardiac CT scheduled for after Christmas. She goes to [facility] for [facility] and hormone management and notices a significant improvement in her fatigue when her hormone levels are well maintained. She has been increasing her activity level and works as a Building control surveyor, Education administrator houses, and at [employer] events. Overall, she feels as though her health has significantly improved and her respiratory status is better.  Declined flu and COVID vaccines.   Allergies  Allergen Reactions   Codeine Other (See Comments)     There is no immunization history on file for this patient.  Past Medical History:  Diagnosis Date   Anxiety    Constipation, chronic    Fatigue    Hormone replacement therapy    Menopausal and postmenopausal disorder 02/12/2021   Menopause 02/12/2021   Mood swings    Obesity (BMI 30-39.9) 02/06/2021   Overweight    Persistent indigestion    Primary hypertension 02/06/2021   Seasonal allergies    TMJ arthralgia     Tobacco History: Social History   Tobacco Use  Smoking Status Former   Packs/day: 0.50   Years: 2.00   Pack years: 1.00   Types: Cigarettes   Quit date: 06/01/2017   Years since quitting: 3.8  Smokeless Tobacco Never   Counseling given: Not Answered   Outpatient Medications Prior to Visit  Medication Sig Dispense Refill   ALLERGY RELIEF 10 MG tablet Take 10 mg by mouth daily.     amLODipine (NORVASC) 5 MG tablet Take 1.5 tablets (7.5 mg total) by mouth daily. 180 tablet 3   buPROPion (WELLBUTRIN XL) 150 MG 24  hr tablet Take 150 mg by mouth 2 (two) times daily.     montelukast (SINGULAIR) 10 MG tablet Take 1 tablet by mouth at bedtime.     omeprazole (PRILOSEC) 20 MG capsule Take 1 capsule by mouth every morning.     phentermine (ADIPEX-P) 37.5 MG tablet Take 37.5 mg by mouth daily.     propranolol (INDERAL) 20 MG tablet Take 1 tablet (20 mg total) by mouth 2 (two) times daily. 180 tablet 3   rosuvastatin  (CRESTOR) 10 MG tablet Take 1 tablet (10 mg total) by mouth daily. 90 tablet 3   WEEKLY-D 1.25 MG (50000 UT) capsule Take 50,000 Units by mouth every 30 (thirty) days. monthy     baclofen (LIORESAL) 10 MG tablet Take 10 mg by mouth 2 (two) times daily as needed.     celecoxib (CELEBREX) 200 MG capsule Take 200 mg by mouth daily. (Patient not taking: Reported on 04/17/2021)     No facility-administered medications prior to visit.     Review of Systems:   Constitutional: No weight loss or gain, night sweats, fevers, chills, +fatigue & lassitude. HEENT: No headaches, difficulty swallowing, tooth/dental problems, or sore throat. No sneezing, itching, ear ache, nasal congestion, or post nasal drip CV:  No chest pain, orthopnea, PND, swelling in lower extremities, anasarca, dizziness, palpitations, syncope Resp: +shortness of breath with exertion (improved per pt) No SOB at rest. No excess mucus or change in color of mucus. Intermittent, rare cough without sputum production. No hemoptysis. No wheezing.  No chest wall deformity GI:  No heartburn, indigestion, abdominal pain, nausea, vomiting, diarrhea, change in bowel habits, loss of appetite, bloody stools.  GU: No dysuria, change in color of urine, urgency or frequency.  No flank pain, no hematuria  Skin: No rash, lesions, ulcerations MSK:  No joint pain or swelling.  No decreased range of motion.  No back pain. Neuro: No dizziness or lightheadedness.  Psych: No depression or anxiety. Mood stable.     Physical Exam:  BP 120/90 (BP Location: Left Arm, Patient Position: Sitting, Cuff Size: Normal)   Pulse 82   Temp 98.1 F (36.7 C) (Oral)   Ht 5\' 1"  (1.549 m)   Wt 170 lb (77.1 kg)   SpO2 100%   BMI 32.12 kg/m   GEN: Pleasant, interactive, well-kempt; obese; in no acute distress. HEENT:  Normocephalic and atraumatic. EACs patent bilaterally. TM pearly gray with present light reflex bilaterally. PERRLA. Sclera white. Nasal turbinates  pink, moist and patent bilaterally. No rhinorrhea present. Oropharynx pink and moist, without exudate or edema. No lesions, ulcerations, or postnasal drip.  NECK:  Supple w/ fair ROM. No JVD present. Normal carotid impulses w/o bruits. Thyroid symmetrical with no goiter or nodules palpated. No lymphadenopathy.   CV: RRR, no m/r/g, no peripheral edema. Pulses intact, +2 bilaterally. No cyanosis, pallor or clubbing. PULMONARY:  Unlabored, regular breathing. Clear bilaterally A&P w/o wheezes/rales/rhonchi. No accessory muscle use. No dullness to percussion. GI: BS present and normoactive. Soft, non-tender to palpation. No organomegaly or masses detected. No CVA tenderness. MSK: No erythema, warmth or tenderness. Cap refil <2 sec all extrem. No deformities or joint swelling noted.  Neuro: A/Ox3. No focal deficits noted.   Skin: Warm, no lesions or rashe Psych: Normal affect and behavior. Judgement and thought content appropriate.     Lab Results:  CBC    Component Value Date/Time   WBC 7.4 03/14/2021 0958   RBC 4.73 03/14/2021 0958   HGB 14.7 03/14/2021  0958   HCT 41.7 03/14/2021 0958   PLT 339 03/14/2021 0958   MCV 88 03/14/2021 0958   MCH 31.1 03/14/2021 0958   MCHC 35.3 03/14/2021 0958   RDW 12.9 03/14/2021 0958    BMET    Component Value Date/Time   NA 138 03/14/2021 0958   K 4.3 03/14/2021 0958   CL 102 03/14/2021 0958   CO2 23 03/14/2021 0958   GLUCOSE 101 (H) 03/14/2021 0958   BUN 16 03/14/2021 0958   CREATININE 0.81 03/14/2021 0958   CALCIUM 9.6 03/14/2021 0958    BNP No results found for: BNP   Imaging:  No results found.    PFT Results Latest Ref Rng & Units 02/21/2021  FVC-Predicted Pre % 98  FVC-Post L 3.32  FVC-Predicted Post % 102  Pre FEV1/FVC % % 79  Post FEV1/FCV % % 83  FEV1-Pre L 2.52  FEV1-Predicted Pre % 96  FEV1-Post L 2.75  DLCO uncorrected ml/min/mmHg 21.63  DLCO UNC% % 112  DLCO corrected ml/min/mmHg 21.63  DLCO COR %Predicted % 112   DLVA Predicted % 108  TLC L 4.95  TLC % Predicted % 107  RV % Predicted % 123    No results found for: NITRICOXIDE      Assessment & Plan:   Mild obstructive sleep apnea AHI was 5.8 and lowest SpO2 89%. Due to very mild severity, discussed conservative measures such as side sleeping position and healthy weight loss. Advised to notify if symptoms persist with these interventions and we can consider an oral appliance at that time. Her fatigue symptoms appear to be related more so to menopause and weight gain and improve with appropriate hormone therapy and increased activity.   Patient Instructions  You have very mild obstructive sleep apnea according to your home sleep study. Side sleeping position. Place a pillow behind your back at night to help avoid a supine position.  Healthy weight management discussed. Continue to follow up at [facility] for weight management and hormone management.  Notify if worsening in your breathing, increased swelling in your legs, or wheezing occurs. Follow up with cardiology as ordered.  Cardiac CT as scheduled.  Continue Singulair 10 mg At bedtime We discussed importance of COVID and flu vaccines today. If you decide you would like either, please let us know or obtain elsewhere. Avoid sick exposures, wash your hands frequently, and mask in crowded places.   Be aware of reduced alertness and do not drive or operate heavy machinery if experiencing this or drowsiness.  Avoid or decrease alcohol consumption and medications that make you more sleepy, if possible. Notify if persistent daytime sleepiness occurs with above interventions. Can discuss oral appliance if fatigue symptoms persist and other health concerns have been optimized.   We discussed how untreated sleep apnea puts an individual at risk for cardiac arrhthymias, pulm HTN, DM, stroke and increases their risk for daytime accidents. We also briefly reviewed treatment options including weight loss,  side sleeping position, oral appliance, CPAP therapy or referral to ENT for possible surgical options  Follow up in six months with Dr. Halford Chessman or APP. If symptoms do not improve or worsen, please contact office for sooner follow up or seek emergency care.    Seasonal allergies See above plan.  Obesity (BMI 30-39.9) Continue current regimen for healthy weight loss. See above plan.  Menopausal and postmenopausal disorder Fatigue appears to be strongly related to menopause and improves with appropriate hormone replacement therapy. See above plan.  Noemi Chapel, NP 04/17/2021  Pt aware and understands NP's role.

## 2021-04-17 ENCOUNTER — Other Ambulatory Visit: Payer: Self-pay

## 2021-04-17 ENCOUNTER — Encounter: Payer: Self-pay | Admitting: Nurse Practitioner

## 2021-04-17 ENCOUNTER — Ambulatory Visit (INDEPENDENT_AMBULATORY_CARE_PROVIDER_SITE_OTHER): Payer: 59 | Admitting: Nurse Practitioner

## 2021-04-17 VITALS — BP 120/90 | HR 82 | Temp 98.1°F | Ht 61.0 in | Wt 170.0 lb

## 2021-04-17 DIAGNOSIS — N959 Unspecified menopausal and perimenopausal disorder: Secondary | ICD-10-CM

## 2021-04-17 DIAGNOSIS — E669 Obesity, unspecified: Secondary | ICD-10-CM | POA: Diagnosis not present

## 2021-04-17 DIAGNOSIS — J302 Other seasonal allergic rhinitis: Secondary | ICD-10-CM

## 2021-04-17 DIAGNOSIS — G4733 Obstructive sleep apnea (adult) (pediatric): Secondary | ICD-10-CM

## 2021-04-17 HISTORY — DX: Obstructive sleep apnea (adult) (pediatric): G47.33

## 2021-04-17 NOTE — Assessment & Plan Note (Signed)
See above plan. 

## 2021-04-17 NOTE — Progress Notes (Signed)
Reviewed and agree with assessment/plan.   Coralyn Helling, MD [facility] Pulmonary/Critical Care 04/17/2021, 10:27 AM Pager:  [phone]

## 2021-04-17 NOTE — Assessment & Plan Note (Signed)
Fatigue appears to be strongly related to menopause and improves with appropriate hormone replacement therapy. See above plan.

## 2021-04-17 NOTE — Patient Instructions (Addendum)
You have very mild obstructive sleep apnea according to your home sleep study. Side sleeping position. Place a pillow behind your back at night to help avoid a supine position.  Healthy weight management discussed. Continue to follow up at [facility] for weight management and hormone management.  Notify if worsening in your breathing, increased swelling in your legs, or wheezing occurs. Follow up with cardiology as ordered.  Cardiac CT as scheduled.  Continue Singulair 10 mg At bedtime We discussed importance of COVID and flu vaccines today. If you decide you would like either, please let us know or obtain elsewhere. Avoid sick exposures, wash your hands frequently, and mask in crowded places.   Be aware of reduced alertness and do not drive or operate heavy machinery if experiencing this or drowsiness.  Avoid or decrease alcohol consumption and medications that make you more sleepy, if possible. Notify if persistent daytime sleepiness occurs with above interventions. Can discuss oral appliance if fatigue symptoms persist and other health concerns have been optimized.   We discussed how untreated sleep apnea puts an individual at risk for cardiac arrhthymias, pulm HTN, DM, stroke and increases their risk for daytime accidents. We also briefly reviewed treatment options including weight loss, side sleeping position, oral appliance, CPAP therapy or referral to ENT for possible surgical options  Follow up in six months with Dr. Craige Cotta or APP. If symptoms do not improve or worsen, please contact office for sooner follow up or seek emergency care.

## 2021-04-17 NOTE — Assessment & Plan Note (Signed)
Continue current regimen for healthy weight loss. See above plan.

## 2021-04-17 NOTE — Assessment & Plan Note (Signed)
AHI was 5.8 and lowest SpO2 89%. Due to very mild severity, discussed conservative measures such as side sleeping position and healthy weight loss. Advised to notify if symptoms persist with these interventions and we can consider an oral appliance at that time. Her fatigue symptoms appear to be related more so to menopause and weight gain and improve with appropriate hormone therapy and increased activity.   Patient Instructions  You have very mild obstructive sleep apnea according to your home sleep study. Side sleeping position. Place a pillow behind your back at night to help avoid a supine position.  Healthy weight management discussed. Continue to follow up at [facility] for weight management and hormone management.  Notify if worsening in your breathing, increased swelling in your legs, or wheezing occurs. Follow up with cardiology as ordered.  Cardiac CT as scheduled.  Continue Singulair 10 mg At bedtime We discussed importance of COVID and flu vaccines today. If you decide you would like either, please let us know or obtain elsewhere. Avoid sick exposures, wash your hands frequently, and mask in crowded places.   Be aware of reduced alertness and do not drive or operate heavy machinery if experiencing this or drowsiness.  Avoid or decrease alcohol consumption and medications that make you more sleepy, if possible. Notify if persistent daytime sleepiness occurs with above interventions. Can discuss oral appliance if fatigue symptoms persist and other health concerns have been optimized.   We discussed how untreated sleep apnea puts an individual at risk for cardiac arrhthymias, pulm HTN, DM, stroke and increases their risk for daytime accidents. We also briefly reviewed treatment options including weight loss, side sleeping position, oral appliance, CPAP therapy or referral to ENT for possible surgical options  Follow up in six months with Dr. Craige Cotta or APP. If symptoms do not improve or worsen,  please contact office for sooner follow up or seek emergency care.

## 2021-04-28 ENCOUNTER — Encounter: Payer: Self-pay | Admitting: Cardiology

## 2021-04-28 MED ORDER — AMLODIPINE BESYLATE 5 MG PO TABS: 7.5000 mg | ORAL_TABLET | Freq: Every day | ORAL | 3 refills | Status: DC

## 2021-04-30 MED ORDER — AMLODIPINE BESYLATE 5 MG PO TABS: 7.5000 mg | ORAL_TABLET | Freq: Every day | ORAL | 3 refills | Status: DC

## 2021-04-30 NOTE — Addendum Note (Signed)
Addended by: Eleonore Chiquito on: 04/30/2021 10:51 AM   Modules accepted: Orders

## 2021-04-30 NOTE — Addendum Note (Signed)
Addended by: Delorse Limber I on: 04/30/2021 10:51 AM   Modules accepted: Orders

## 2021-06-03 DIAGNOSIS — R635 Abnormal weight gain: Secondary | ICD-10-CM | POA: Diagnosis not present

## 2021-06-03 DIAGNOSIS — Z6830 Body mass index (BMI) 30.0-30.9, adult: Secondary | ICD-10-CM | POA: Diagnosis not present

## 2021-06-03 DIAGNOSIS — Z79899 Other long term (current) drug therapy: Secondary | ICD-10-CM | POA: Diagnosis not present

## 2021-06-09 ENCOUNTER — Telehealth: Payer: Self-pay | Admitting: Cardiology

## 2021-06-09 MED ORDER — PROPRANOLOL HCL 20 MG PO TABS: 20.0000 mg | ORAL_TABLET | Freq: Two times a day (BID) | ORAL | 3 refills | Status: DC

## 2021-06-09 MED ORDER — AMLODIPINE BESYLATE 5 MG PO TABS: 7.5000 mg | ORAL_TABLET | Freq: Every day | ORAL | 3 refills | Status: DC

## 2021-06-09 NOTE — Telephone Encounter (Signed)
Refill sent in per request.  

## 2021-06-09 NOTE — Telephone Encounter (Signed)
°*  STAT* If patient is at the pharmacy, call can be transferred to refill team.   1. Which medications need to be refilled? (please list name of each medication and dose if known)  amLODipine (NORVASC) 5 MG tablet propranolol (INDERAL) 20 MG tablet   2. Which pharmacy/location (including street and city if local pharmacy) is medication to be sent to? [facility] - [place] - [address]  3. Do they need a 30 day or 90 day supply? 90  Patient is scheduled to see Dr. Tomie China 09/19/21

## 2021-06-12 ENCOUNTER — Telehealth: Payer: Self-pay | Admitting: Cardiology

## 2021-06-12 MED ORDER — ROSUVASTATIN CALCIUM 10 MG PO TABS: 10.0000 mg | ORAL_TABLET | Freq: Every day | ORAL | 3 refills | Status: DC

## 2021-06-12 NOTE — Telephone Encounter (Signed)
°*  STAT* If patient is at the pharmacy, call can be transferred to refill team.   1. Which medications need to be refilled? (please list name of each medication and dose if known) rosuvastatin (CRESTOR) 10 MG tablet  2. Which pharmacy/location (including street and city if local pharmacy) is medication to be sent to? [facility] - [place], [place] - [address]  3. Do they need a 30 day or 90 day supply? 90 day

## 2021-06-12 NOTE — Telephone Encounter (Signed)
Refill sent in per request.  

## 2021-06-17 ENCOUNTER — Ambulatory Visit: Payer: 59 | Admitting: Cardiology

## 2021-06-25 DIAGNOSIS — J019 Acute sinusitis, unspecified: Secondary | ICD-10-CM | POA: Diagnosis not present

## 2021-07-03 DIAGNOSIS — Z6830 Body mass index (BMI) 30.0-30.9, adult: Secondary | ICD-10-CM | POA: Diagnosis not present

## 2021-07-03 DIAGNOSIS — R635 Abnormal weight gain: Secondary | ICD-10-CM | POA: Diagnosis not present

## 2021-07-03 DIAGNOSIS — Z79899 Other long term (current) drug therapy: Secondary | ICD-10-CM | POA: Diagnosis not present

## 2021-07-08 DIAGNOSIS — J209 Acute bronchitis, unspecified: Secondary | ICD-10-CM | POA: Diagnosis not present

## 2021-07-08 DIAGNOSIS — J019 Acute sinusitis, unspecified: Secondary | ICD-10-CM | POA: Diagnosis not present

## 2021-07-08 DIAGNOSIS — R059 Cough, unspecified: Secondary | ICD-10-CM | POA: Diagnosis not present

## 2021-08-07 DIAGNOSIS — Z6830 Body mass index (BMI) 30.0-30.9, adult: Secondary | ICD-10-CM | POA: Diagnosis not present

## 2021-08-07 DIAGNOSIS — R635 Abnormal weight gain: Secondary | ICD-10-CM | POA: Diagnosis not present

## 2021-09-19 ENCOUNTER — Ambulatory Visit: Payer: Self-pay | Admitting: Cardiology

## 2021-10-04 DIAGNOSIS — J0100 Acute maxillary sinusitis, unspecified: Secondary | ICD-10-CM | POA: Diagnosis not present

## 2021-10-06 DIAGNOSIS — J42 Unspecified chronic bronchitis: Secondary | ICD-10-CM | POA: Diagnosis not present

## 2021-10-06 DIAGNOSIS — J309 Allergic rhinitis, unspecified: Secondary | ICD-10-CM | POA: Diagnosis not present

## 2021-10-06 DIAGNOSIS — J209 Acute bronchitis, unspecified: Secondary | ICD-10-CM | POA: Diagnosis not present

## 2021-10-06 DIAGNOSIS — J019 Acute sinusitis, unspecified: Secondary | ICD-10-CM | POA: Diagnosis not present

## 2021-12-16 ENCOUNTER — Encounter: Payer: Self-pay | Admitting: Nurse Practitioner

## 2021-12-31 DIAGNOSIS — I10 Essential (primary) hypertension: Secondary | ICD-10-CM | POA: Diagnosis not present

## 2021-12-31 DIAGNOSIS — F419 Anxiety disorder, unspecified: Secondary | ICD-10-CM | POA: Diagnosis not present

## 2021-12-31 DIAGNOSIS — N951 Menopausal and female climacteric states: Secondary | ICD-10-CM | POA: Diagnosis not present

## 2021-12-31 DIAGNOSIS — Z6832 Body mass index (BMI) 32.0-32.9, adult: Secondary | ICD-10-CM | POA: Diagnosis not present

## 2021-12-31 DIAGNOSIS — R69 Illness, unspecified: Secondary | ICD-10-CM | POA: Diagnosis not present

## 2022-01-06 DIAGNOSIS — E559 Vitamin D deficiency, unspecified: Secondary | ICD-10-CM | POA: Diagnosis not present

## 2022-01-06 DIAGNOSIS — Z0000 Encounter for general adult medical examination without abnormal findings: Secondary | ICD-10-CM | POA: Diagnosis not present

## 2022-01-06 DIAGNOSIS — Z6832 Body mass index (BMI) 32.0-32.9, adult: Secondary | ICD-10-CM | POA: Diagnosis not present

## 2022-01-06 DIAGNOSIS — Z79899 Other long term (current) drug therapy: Secondary | ICD-10-CM | POA: Diagnosis not present

## 2022-01-14 DIAGNOSIS — J069 Acute upper respiratory infection, unspecified: Secondary | ICD-10-CM | POA: Diagnosis not present

## 2022-01-14 DIAGNOSIS — R519 Headache, unspecified: Secondary | ICD-10-CM | POA: Diagnosis not present

## 2022-01-14 DIAGNOSIS — R070 Pain in throat: Secondary | ICD-10-CM | POA: Diagnosis not present

## 2022-02-11 DIAGNOSIS — R69 Illness, unspecified: Secondary | ICD-10-CM | POA: Diagnosis not present

## 2022-02-11 DIAGNOSIS — F419 Anxiety disorder, unspecified: Secondary | ICD-10-CM | POA: Diagnosis not present

## 2022-02-11 DIAGNOSIS — Z6832 Body mass index (BMI) 32.0-32.9, adult: Secondary | ICD-10-CM | POA: Diagnosis not present

## 2022-02-11 DIAGNOSIS — K219 Gastro-esophageal reflux disease without esophagitis: Secondary | ICD-10-CM | POA: Diagnosis not present

## 2022-02-11 DIAGNOSIS — E669 Obesity, unspecified: Secondary | ICD-10-CM | POA: Diagnosis not present

## 2022-02-20 DIAGNOSIS — R42 Dizziness and giddiness: Secondary | ICD-10-CM | POA: Diagnosis not present

## 2022-02-20 DIAGNOSIS — J0181 Other acute recurrent sinusitis: Secondary | ICD-10-CM | POA: Diagnosis not present

## 2022-03-13 DIAGNOSIS — R69 Illness, unspecified: Secondary | ICD-10-CM | POA: Diagnosis not present

## 2022-03-25 DIAGNOSIS — R69 Illness, unspecified: Secondary | ICD-10-CM | POA: Diagnosis not present

## 2022-04-06 DIAGNOSIS — R69 Illness, unspecified: Secondary | ICD-10-CM | POA: Diagnosis not present

## 2022-04-20 ENCOUNTER — Other Ambulatory Visit: Payer: Self-pay

## 2022-04-20 MED ORDER — AMLODIPINE BESYLATE 5 MG PO TABS: 7.5000 mg | ORAL_TABLET | Freq: Every day | ORAL | 0 refills | Status: DC

## 2022-05-21 ENCOUNTER — Other Ambulatory Visit: Payer: Self-pay | Admitting: Cardiology

## 2022-05-30 ENCOUNTER — Other Ambulatory Visit: Payer: Self-pay | Admitting: Cardiology

## 2022-06-04 DIAGNOSIS — M545 Low back pain, unspecified: Secondary | ICD-10-CM | POA: Diagnosis not present

## 2022-06-04 DIAGNOSIS — R69 Illness, unspecified: Secondary | ICD-10-CM | POA: Diagnosis not present

## 2022-06-04 DIAGNOSIS — F419 Anxiety disorder, unspecified: Secondary | ICD-10-CM | POA: Diagnosis not present

## 2022-06-04 DIAGNOSIS — Z6832 Body mass index (BMI) 32.0-32.9, adult: Secondary | ICD-10-CM | POA: Diagnosis not present

## 2022-06-05 DIAGNOSIS — R051 Acute cough: Secondary | ICD-10-CM | POA: Diagnosis not present

## 2022-06-05 DIAGNOSIS — R0981 Nasal congestion: Secondary | ICD-10-CM | POA: Diagnosis not present

## 2022-06-12 ENCOUNTER — Other Ambulatory Visit: Payer: Self-pay | Admitting: Cardiology

## 2022-06-12 NOTE — Telephone Encounter (Signed)
Amlodipine 5 mg # 22 tablets only with message needs appointment / 3rd final attempt sent to   [id number] - [place], [place]

## 2022-06-14 ENCOUNTER — Other Ambulatory Visit: Payer: Self-pay | Admitting: Cardiology

## 2022-06-15 ENCOUNTER — Telehealth: Payer: Self-pay | Admitting: Cardiology

## 2022-06-15 MED ORDER — AMLODIPINE BESYLATE 5 MG PO TABS: 7.5000 mg | ORAL_TABLET | Freq: Every day | ORAL | 0 refills | Status: DC

## 2022-06-15 MED ORDER — PROPRANOLOL HCL 20 MG PO TABS: 20.0000 mg | ORAL_TABLET | Freq: Two times a day (BID) | ORAL | 0 refills | Status: DC

## 2022-06-15 NOTE — Telephone Encounter (Signed)
RX sent

## 2022-06-15 NOTE — Telephone Encounter (Signed)
*  STAT* If patient is at the pharmacy, call can be transferred to refill team.   1. Which medications need to be refilled? (please list name of each medication and dose if known)  amLODipine (NORVASC) 5 MG tablet  propranolol (INDERAL) 20 MG tablet    2. Which pharmacy/location (including street and city if local pharmacy) is medication to be sent to?  Take 1 tablet (20 mg total) by mouth 2 (two) times daily.   3. Do they need a 30 day or 90 day supply? Enough to last until her appt on 07/02/22 with Dr Geraldo Pitter.

## 2022-06-16 ENCOUNTER — Other Ambulatory Visit: Payer: Self-pay | Admitting: Cardiology

## 2022-06-17 ENCOUNTER — Other Ambulatory Visit: Payer: Self-pay | Admitting: Cardiology

## 2022-06-17 ENCOUNTER — Telehealth: Payer: Self-pay | Admitting: Cardiology

## 2022-06-17 MED ORDER — PROPRANOLOL HCL 20 MG PO TABS: 20.0000 mg | ORAL_TABLET | Freq: Two times a day (BID) | ORAL | 0 refills | Status: DC

## 2022-06-17 MED ORDER — AMLODIPINE BESYLATE 5 MG PO TABS: 7.5000 mg | ORAL_TABLET | Freq: Every day | ORAL | 0 refills | Status: DC

## 2022-06-17 NOTE — Telephone Encounter (Signed)
[facility] [address]. Rx's were sent to wrong pharmacy

## 2022-06-17 NOTE — Telephone Encounter (Signed)
Called patient regarding a question about which pharmacy her RX was sent to. No answer, no VM to leave message.

## 2022-06-25 ENCOUNTER — Other Ambulatory Visit: Payer: Self-pay

## 2022-06-29 ENCOUNTER — Other Ambulatory Visit: Payer: Self-pay | Admitting: Oncology

## 2022-06-29 DIAGNOSIS — D72829 Elevated white blood cell count, unspecified: Secondary | ICD-10-CM

## 2022-06-29 NOTE — Progress Notes (Signed)
[facility]  [address] [place],  [place]  [address] [phone]  Clinic Day:  06/30/2022  Referring physician: Lucianne Lei, MD   HISTORY OF PRESENT ILLNESS:  The patient is a 47 y.o. female  who I was asked to consult upon for leukocytosis.  Recent labs showed a mildly elevated white count of 11.7.  According to the patient, these labs were done as part of a routine follow-up.  The patient was dealing with the flu for which she was on prednisone between late December 2023/early January 2024. Since then, the patient denies having any other health issues.  However, she claims to have had mild night sweats over the past few weeks.  With respect to her leukocytosis, she denies having undergone a previous splenectomy.  She denies being on any other medicines which are known to cause leukocytosis.  The patient previously smoked, but has not done so in 5 years.  Of note, she does have suboptimal dentition, for which she is scheduled to see a dentist in the forthcoming weeks.  Although she has occasional night sweats, she denies having any significant B symptoms which concern her for her leukocytosis being due to to an underlying hematologic malignancy.  Overall, she denies having any significant changes in her health over these past few months.  With respect to her family, the patient claims her father died from an unspecified bone marrow cancer a few years ago, which has her concerned as it pertains to her blood counts.  PAST MEDICAL HISTORY:   Past Medical History:  Diagnosis Date   Anxiety    Constipation, chronic    Fatigue    GERD (gastroesophageal reflux disease)    Hormone replacement therapy    Menopausal and postmenopausal disorder 02/12/2021   Menopause 02/12/2021   Mild obstructive sleep apnea 04/17/2021   Mixed dyslipidemia 02/14/2021   Mood swings    Obesity (BMI 30-39.9) 02/06/2021   Overweight    Persistent indigestion    Primary  hypertension 02/06/2021   Seasonal allergies    TMJ arthralgia     PAST SURGICAL HISTORY:   Past Surgical History:  Procedure Laterality Date   ABDOMINAL HYSTERECTOMY     CESAREAN SECTION  1997   CHOLECYSTECTOMY  2004   CYSTECTOMY  1999   TUBAL LIGATION  1998    CURRENT MEDICATIONS:   Current Outpatient Medications  Medication Sig Dispense Refill   albuterol (VENTOLIN HFA) 108 (90 Base) MCG/ACT inhaler Inhale 2 puffs into the lungs every 6 (six) hours as needed.     levothyroxine (SYNTHROID) 75 MCG tablet Take 75 mcg by mouth every morning.     liothyronine (CYTOMEL) 5 MCG tablet Take 5 mcg by mouth every morning.     ALLERGY RELIEF 10 MG tablet Take 10 mg by mouth daily.     amLODipine (NORVASC) 5 MG tablet Take 1.5 tablets (7.5 mg total) by mouth daily. Patient needs an appointment for further refills. 2 nd attempt 26 tablet 0   baclofen (LIORESAL) 10 MG tablet Take 10 mg by mouth 2 (two) times daily as needed for muscle spasms.     budesonide-formoterol (SYMBICORT) 160-4.5 MCG/ACT inhaler Inhale 2 puffs into the lungs every 6 (six) hours as needed (wheezing or shortness of breath).     buPROPion (WELLBUTRIN XL) 300 MG 24 hr tablet Take 300 mg by mouth daily.     cloNIDine (CATAPRES) 0.1 MG tablet Take 0.1 mg by mouth 2 (two) times daily.  fluticasone (FLONASE) 50 MCG/ACT nasal spray Place 1-2 sprays into both nostrils 2 (two) times daily.     meloxicam (MOBIC) 15 MG tablet Take 15 mg by mouth daily.     montelukast (SINGULAIR) 10 MG tablet Take 1 tablet by mouth at bedtime.     omeprazole (PRILOSEC) 20 MG capsule Take 1 capsule by mouth every morning.     phentermine (ADIPEX-P) 37.5 MG tablet Take 37.5 mg by mouth daily.     progesterone (PROMETRIUM) 100 MG capsule Take 100 mg by mouth at bedtime.     propranolol (INDERAL) 20 MG tablet Take 1 tablet (20 mg total) by mouth 2 (two) times daily. Patient must keep appointment for 07/02/22 for further refills. 2 nd attempt 30 tablet  0   rosuvastatin (CRESTOR) 10 MG tablet TAKE 1 TABLET BY MOUTH EVERY DAY 30 tablet 0   venlafaxine XR (EFFEXOR-XR) 150 MG 24 hr capsule Take 150 mg by mouth daily.     Vitamin D, Ergocalciferol, (DRISDOL) 1.25 MG (50000 UNIT) CAPS capsule Take 50,000 Units by mouth every 30 (thirty) days.     No current facility-administered medications for this visit.    ALLERGIES:   Allergies  Allergen Reactions   Codeine Other (See Comments)    FAMILY HISTORY:   Family History  Problem Relation Age of Onset   Heart failure Mother    Asthma Mother    Seizures Mother    Thyroid disease Mother    Hypertension Father    Arthritis Father    Anemia Father    Asthma Father    Stroke Father    Heart disease Father    Hypercholesterolemia Father    Heart disease Paternal Grandmother    Hypercholesterolemia Paternal Grandmother    Colon cancer Paternal Grandmother    Lung cancer Paternal Grandfather    Breast cancer Paternal Aunt     SOCIAL HISTORY:  The patient was born and raised in [place].  [place].  She is single, with 2 children and 3 grandchildren.  She currently does housecleaning work.  The patient smoked less than a pack of cigarettes daily for 5 years before quitting 5 years ago.  She denies a history of alcohol abuse.  REVIEW OF SYSTEMS:  Review of Systems  Constitutional:  Positive for fatigue. Negative for fever.  HENT:   Negative for hearing loss and sore throat.   Eyes:  Negative for eye problems.  Respiratory:  Positive for shortness of breath. Negative for chest tightness, cough and hemoptysis.   Cardiovascular:  Negative for chest pain and palpitations.  Gastrointestinal:  Positive for constipation and nausea. Negative for abdominal distention, abdominal pain, blood in stool, diarrhea and vomiting.  Endocrine: Negative for hot flashes.  Genitourinary:  Negative for difficulty urinating, dysuria, frequency, hematuria and nocturia.    Musculoskeletal:  Positive for arthralgias. Negative for back pain, gait problem and myalgias.  Skin: Negative.  Negative for itching and rash.  Neurological:  Positive for headaches. Negative for dizziness, extremity weakness, gait problem, light-headedness and numbness.  Hematological: Negative.   Psychiatric/Behavioral:  Positive for depression. Negative for suicidal ideas. The patient is nervous/anxious.      PHYSICAL EXAM:  Blood pressure (!) 144/90, pulse 66, temperature 98.3 F (36.8 C), resp. rate 16, height 5' 2.25" (1.581 m), weight 178 lb 8 oz (81 kg), SpO2 99 %. Wt Readings from Last 3 Encounters:  06/30/22 178 lb 8 oz (81 kg)  04/17/21 170 lb (77.1  kg)  02/21/21 178 lb 3.2 oz (80.8 kg)   Body mass index is 32.39 kg/m. Performance status (ECOG): 0 - Asymptomatic Physical Exam Constitutional:      Appearance: Normal appearance. She is not ill-appearing.  HENT:     Mouth/Throat:     Mouth: Mucous membranes are moist.     Dentition: Abnormal dentition. Dental caries present.     Pharynx: Oropharynx is clear. No oropharyngeal exudate or posterior oropharyngeal erythema.  Cardiovascular:     Rate and Rhythm: Normal rate and regular rhythm.     Heart sounds: No murmur heard.    No friction rub. No gallop.  Pulmonary:     Effort: Pulmonary effort is normal. No respiratory distress.     Breath sounds: Normal breath sounds. No wheezing, rhonchi or rales.  Abdominal:     General: Bowel sounds are normal. There is no distension.     Palpations: Abdomen is soft. There is no mass.     Tenderness: There is no abdominal tenderness.  Musculoskeletal:        General: No swelling.     Right lower leg: No edema.     Left lower leg: No edema.  Lymphadenopathy:     Cervical: No cervical adenopathy.     Upper Body:     Right upper body: No supraclavicular or axillary adenopathy.     Left upper body: No supraclavicular or axillary adenopathy.     Lower Body: No right inguinal  adenopathy. No left inguinal adenopathy.  Skin:    General: Skin is warm.     Coloration: Skin is not jaundiced.     Findings: No lesion or rash.  Neurological:     General: No focal deficit present.     Mental Status: She is alert and oriented to person, place, and time. Mental status is at baseline.  Psychiatric:        Mood and Affect: Mood normal.        Behavior: Behavior normal.        Thought Content: Thought content normal.    LABS:    ASSESSMENT & PLAN:  A 47 y.o. female who I was asked to consult upon for leukocytosis.  When evaluating her labs today, her white count is only minimally elevated.  Furthermore, her white count differential is completely normal.  I am also comforted by the fact that her hemoglobin and platelets are normal.  Overall, I do believe her mild leukocytosis is due to her dental issues.  She definitely appears to have some infected teeth, for which any form of infection can lead to mild leukocytosis.  As mentioned previously, she is scheduled to see a dentist in the forthcoming weeks.  Outside of this, there is nothing ominous per her history of physical exam to suggest a more worrisome reason is behind her leukocytosis.  I do feel comfortable turning her care back over to her primary care office, with the recommendation that her CBC be checked, at most, 1-2 times per year.  I would not have a problem seeing her in the future if new/worrisome hematologic issues develop that require repeat clinical assessment.  The patient understands all the plans discussed today and is in agreement with them.  I do appreciate Nicholos Johns, MD for his new consult.   Ledonna Dormer Macarthur Critchley, MD

## 2022-06-30 ENCOUNTER — Inpatient Hospital Stay: Payer: 59

## 2022-06-30 ENCOUNTER — Other Ambulatory Visit: Payer: Self-pay | Admitting: Cardiology

## 2022-06-30 ENCOUNTER — Encounter: Payer: Self-pay | Admitting: Oncology

## 2022-06-30 ENCOUNTER — Inpatient Hospital Stay: Payer: 59 | Attending: Oncology | Admitting: Oncology

## 2022-06-30 VITALS — BP 144/90 | HR 66 | Temp 98.3°F | Resp 16 | Ht 62.25 in | Wt 178.5 lb

## 2022-06-30 DIAGNOSIS — Z803 Family history of malignant neoplasm of breast: Secondary | ICD-10-CM

## 2022-06-30 DIAGNOSIS — Z800 Family history of malignant neoplasm of digestive organs: Secondary | ICD-10-CM

## 2022-06-30 DIAGNOSIS — Z87891 Personal history of nicotine dependence: Secondary | ICD-10-CM | POA: Diagnosis not present

## 2022-06-30 DIAGNOSIS — Z801 Family history of malignant neoplasm of trachea, bronchus and lung: Secondary | ICD-10-CM | POA: Diagnosis not present

## 2022-06-30 DIAGNOSIS — D72829 Elevated white blood cell count, unspecified: Secondary | ICD-10-CM | POA: Diagnosis not present

## 2022-06-30 LAB — CBC AND DIFFERENTIAL
HCT: 44 (ref 36–46)
Hemoglobin: 14.8 (ref 12.0–16.0)
Neutrophils Absolute: 6.03
Platelets: 320 10*3/uL (ref 150–400)
WBC: 12.3

## 2022-06-30 LAB — CBC: RBC: 4.78 (ref 3.87–5.11)

## 2022-07-02 ENCOUNTER — Encounter: Payer: Self-pay | Admitting: Cardiology

## 2022-07-02 ENCOUNTER — Ambulatory Visit: Payer: 59 | Attending: Cardiology | Admitting: Cardiology

## 2022-07-02 VITALS — BP 110/80 | HR 64 | Ht 61.0 in | Wt 180.8 lb

## 2022-07-02 DIAGNOSIS — I10 Essential (primary) hypertension: Secondary | ICD-10-CM

## 2022-07-02 DIAGNOSIS — E669 Obesity, unspecified: Secondary | ICD-10-CM | POA: Diagnosis not present

## 2022-07-02 DIAGNOSIS — G4733 Obstructive sleep apnea (adult) (pediatric): Secondary | ICD-10-CM | POA: Diagnosis not present

## 2022-07-02 DIAGNOSIS — E782 Mixed hyperlipidemia: Secondary | ICD-10-CM

## 2022-07-02 NOTE — Progress Notes (Signed)
Cardiology Office Note:    Date:  07/02/2022   ID:  Elizabeth Montoya, DOB Sep 11, 1975, MRN [id number]  PCP:  Nicholos Johns, MD  Cardiologist:  Jenean Lindau, MD   Referring MD: Nicholos Johns, MD    ASSESSMENT:    1. Primary hypertension   2. Mild obstructive sleep apnea   3. Mixed dyslipidemia   4. Obesity (BMI 30-39.9)    PLAN:    In order of problems listed above:  Primary prevention stressed with patient.  Importance of compliance with diet medication stressed and she vocalized understanding.  She was advised to walk at least half an hour a day 5 days a week and she agrees to do so.  Risk stratification with calcium scoring was recommended and she agrees. Essential hypertension: Blood pressure stable and diet was emphasized.  She has an element of whitecoat hypertension and is an anxious lady. Mixed dyslipidemia: On lipid-lowering medications.  Followed by primary care.  Diet was emphasized.  Lipids were reviewed and much improved with statins. Obesity and sleep apnea: Weight reduction stressed.  Diet emphasized.  Risks of obesity explained and she promises to do better. Patient will be seen in follow-up appointment in 6 months or earlier if the patient has any concerns    Medication Adjustments/Labs and Tests Ordered: Current medicines are reviewed at length with the patient today.  Concerns regarding medicines are outlined above.  No orders of the defined types were placed in this encounter.  No orders of the defined types were placed in this encounter.    No chief complaint on file.    History of Present Illness:    Elizabeth Montoya is a 47 y.o. female.  Patient has past medical history of essential hypertension, dyslipidemia, obesity.  She mentions to me that she is overall an active lifestyle but does not exercise on a regular basis.  No chest pain orthopnea or PND.  At the time of my evaluation, the patient is alert awake oriented and in no distress.  Past Medical  History:  Diagnosis Date   Anxiety    Constipation, chronic    Fatigue    GERD (gastroesophageal reflux disease)    Hormone replacement therapy    Menopausal and postmenopausal disorder 02/12/2021   Menopause 02/12/2021   Mild obstructive sleep apnea 04/17/2021   Mixed dyslipidemia 02/14/2021   Mood swings    Obesity (BMI 30-39.9) 02/06/2021   Overweight    Persistent indigestion    Primary hypertension 02/06/2021   Seasonal allergies    TMJ arthralgia     Past Surgical History:  Procedure Laterality Date   ABDOMINAL HYSTERECTOMY     [place]  2004   CYSTECTOMY  1999   TUBAL LIGATION  1998    Current Medications: Current Meds  Medication Sig   albuterol (VENTOLIN HFA) 108 (90 Base) MCG/ACT inhaler Inhale 2 puffs into the lungs every 6 (six) hours as needed.   ALLERGY RELIEF 10 MG tablet Take 10 mg by mouth daily.   amLODipine (NORVASC) 5 MG tablet Take 1.5 tablets (7.5 mg total) by mouth daily. Patient needs an appointment for further refills. 2 nd attempt   baclofen (LIORESAL) 10 MG tablet Take 10 mg by mouth 2 (two) times daily as needed for muscle spasms.   budesonide-formoterol (SYMBICORT) 160-4.5 MCG/ACT inhaler Inhale 2 puffs into the lungs every 6 (six) hours as needed (wheezing or shortness of breath).   buPROPion (WELLBUTRIN XL) 300 MG 24 hr  tablet Take 300 mg by mouth daily.   cloNIDine (CATAPRES) 0.1 MG tablet Take 0.1 mg by mouth 2 (two) times daily.   fluticasone (FLONASE) 50 MCG/ACT nasal spray Place 1-2 sprays into both nostrils 2 (two) times daily.   levothyroxine (SYNTHROID) 75 MCG tablet Take 75 mcg by mouth every morning.   liothyronine (CYTOMEL) 5 MCG tablet Take 5 mcg by mouth every morning.   meloxicam (MOBIC) 15 MG tablet Take 15 mg by mouth daily.   montelukast (SINGULAIR) 10 MG tablet Take 1 tablet by mouth at bedtime.   omeprazole (PRILOSEC) 20 MG capsule Take 1 capsule by mouth every morning.   phentermine  (ADIPEX-P) 37.5 MG tablet Take 37.5 mg by mouth daily.   progesterone (PROMETRIUM) 100 MG capsule Take 100 mg by mouth at bedtime.   propranolol (INDERAL) 20 MG tablet Take 1 tablet (20 mg total) by mouth 2 (two) times daily. Patient must keep appointment for 07/02/22 for further refills. 2 nd attempt   rosuvastatin (CRESTOR) 10 MG tablet TAKE 1 TABLET BY MOUTH EVERY DAY   venlafaxine XR (EFFEXOR-XR) 75 MG 24 hr capsule Take 75 mg by mouth daily.   Vitamin D, Ergocalciferol, (DRISDOL) 1.25 MG (50000 UNIT) CAPS capsule Take 50,000 Units by mouth every 30 (thirty) days.     Allergies:   Codeine   Social History   Socioeconomic History   Marital status: Single    Spouse name: Not on file   Number of children: 2   Years of education: 12   Highest education level: Not on file  Occupational History   Occupation: JANITORIAL  Tobacco Use   Smoking status: Former    Packs/day: 0.50    Years: 2.00    Total pack years: 1.00    Types: Cigarettes    Quit date: 06/01/2017    Years since quitting: 5.0   Smokeless tobacco: Never  Vaping Use   Vaping Use: Never used  Substance and Sexual Activity   Alcohol use: Not Currently   Drug use: Not Currently   Sexual activity: Not Currently  Other Topics Concern   Not on file  Social History Narrative   ** Merged History Encounter **       Social Determinants of Health   Financial Resource Strain: Not on file  Food Insecurity: Food Insecurity Present (06/30/2022)   Hunger Vital Sign    Worried About Running Out of Food in the Last Year: Sometimes true    Ran Out of Food in the Last Year: Never true  Transportation Needs: No Transportation Needs (06/30/2022)   PRAPARE - Hydrologist (Medical): No    Lack of Transportation (Non-Medical): No  Physical Activity: Not on file  Stress: Not on file  Social Connections: Not on file     Family History: The patient's family history includes Anemia in her father; Arthritis  in her father; Asthma in her father and mother; Breast cancer in her paternal aunt; Colon cancer in her paternal grandmother; Heart disease in her father and paternal grandmother; Heart failure in her mother; Hypercholesterolemia in her father and paternal grandmother; Hypertension in her father; Lung cancer in her paternal grandfather; Seizures in her mother; Stroke in her father; Thyroid disease in her mother.  ROS:   Please see the history of present illness.    All other systems reviewed and are negative.  EKGs/Labs/Other Studies Reviewed:    The following studies were reviewed today: EKG reveals sinus rhythm and nonspecific  ST-T changes   Recent Labs: 06/30/2022: Hemoglobin 14.8; Platelets 320  Recent Lipid Panel    Component Value Date/Time   CHOL 245 (H) 03/14/2021 0958   TRIG 156 (H) 03/14/2021 0958   HDL 43 03/14/2021 0958   CHOLHDL 5.7 (H) 03/14/2021 0958   LDLCALC 173 (H) 03/14/2021 0958    Physical Exam:    VS:  BP 124/88   Pulse 64   Ht 5\' 1"  (1.549 m)   Wt 180 lb 12.8 oz (82 kg)   SpO2 97%   BMI 34.16 kg/m     Wt Readings from Last 3 Encounters:  07/02/22 180 lb 12.8 oz (82 kg)  06/30/22 178 lb 8 oz (81 kg)  04/17/21 170 lb (77.1 kg)     GEN: Patient is in no acute distress HEENT: Normal NECK: No JVD; No carotid bruits LYMPHATICS: No lymphadenopathy CARDIAC: Hear sounds regular, 2/6 systolic murmur at the apex. RESPIRATORY:  Clear to auscultation without rales, wheezing or rhonchi  ABDOMEN: Soft, non-tender, non-distended MUSCULOSKELETAL:  No edema; No deformity  SKIN: Warm and dry NEUROLOGIC:  Alert and oriented x 3 PSYCHIATRIC:  Normal affect   Signed, Jenean Lindau, MD  07/02/2022 8:40 AM    [facility]

## 2022-07-02 NOTE — Patient Instructions (Signed)
Medication Instructions:  Your physician recommends that you continue on your current medications as directed. Please refer to the Current Medication list given to you today.  *If you need a refill on your cardiac medications before your next appointment, please call your pharmacy*   Lab Work: NONE If you have labs (blood work) drawn today and your tests are completely normal, you will receive your results only by: [place] (if you have MyChart) OR A paper copy in the mail If you have any lab test that is abnormal or we need to change your treatment, we will call you to review the results.   Testing/Procedures: We will order CT coronary calcium score. It will cost $99.00 and iis due at time of scan.  Please call to schedule.     [facility] [address] [place], [place] [address] [phone]  Or  [facility] [address] [place], [place] [address] [phone] option [phone]    Follow-Up: At [facility], you and your health needs are our priority.  As part of our continuing mission to provide you with exceptional heart care, we have created designated Provider Care Teams.  These Care Teams include your primary Cardiologist (physician) and Advanced Practice Providers (APPs -  Physician Assistants and Nurse Practitioners) who all work together to provide you with the care you need, when you need it.  We recommend signing up for the patient portal called "MyChart".  Sign up information is provided on this After Visit Summary.  MyChart is used to connect with patients for Virtual Visits (Telemedicine).  Patients are able to view lab/test results, encounter notes, upcoming appointments, etc.  Non-urgent messages can be sent to your provider as well.   To learn more about what you can do with MyChart, go to [email or web address].    Your next appointment:   9 month(s) Provider:   Jyl Heinz, MD   Other Instructions

## 2022-07-08 DIAGNOSIS — M79672 Pain in left foot: Secondary | ICD-10-CM | POA: Diagnosis not present

## 2022-07-08 DIAGNOSIS — Z6832 Body mass index (BMI) 32.0-32.9, adult: Secondary | ICD-10-CM | POA: Diagnosis not present

## 2022-07-14 ENCOUNTER — Encounter (HOSPITAL_BASED_OUTPATIENT_CLINIC_OR_DEPARTMENT_OTHER): Payer: Self-pay | Admitting: Radiology

## 2022-07-14 ENCOUNTER — Ambulatory Visit (HOSPITAL_BASED_OUTPATIENT_CLINIC_OR_DEPARTMENT_OTHER)
Admission: RE | Admit: 2022-07-14 | Discharge: 2022-07-14 | Disposition: A | Discharge status: Home / Self Care | Payer: 59 | Source: Ambulatory Visit | Attending: Cardiology | Admitting: Cardiology

## 2022-07-14 DIAGNOSIS — I10 Essential (primary) hypertension: Secondary | ICD-10-CM

## 2022-07-14 DIAGNOSIS — R41840 Attention and concentration deficit: Secondary | ICD-10-CM | POA: Diagnosis not present

## 2022-07-14 DIAGNOSIS — F419 Anxiety disorder, unspecified: Secondary | ICD-10-CM | POA: Diagnosis not present

## 2022-07-14 DIAGNOSIS — R69 Illness, unspecified: Secondary | ICD-10-CM | POA: Diagnosis not present

## 2022-07-14 DIAGNOSIS — E782 Mixed hyperlipidemia: Secondary | ICD-10-CM

## 2022-07-14 DIAGNOSIS — G4733 Obstructive sleep apnea (adult) (pediatric): Secondary | ICD-10-CM

## 2022-07-14 DIAGNOSIS — E669 Obesity, unspecified: Secondary | ICD-10-CM | POA: Insufficient documentation

## 2022-07-16 DIAGNOSIS — M129 Arthropathy, unspecified: Secondary | ICD-10-CM | POA: Diagnosis not present

## 2022-07-16 DIAGNOSIS — R110 Nausea: Secondary | ICD-10-CM | POA: Diagnosis not present

## 2022-07-16 DIAGNOSIS — R69 Illness, unspecified: Secondary | ICD-10-CM | POA: Diagnosis not present

## 2022-07-16 DIAGNOSIS — F419 Anxiety disorder, unspecified: Secondary | ICD-10-CM | POA: Diagnosis not present

## 2022-07-18 ENCOUNTER — Other Ambulatory Visit: Payer: Self-pay | Admitting: Cardiology

## 2022-07-21 DIAGNOSIS — Z1231 Encounter for screening mammogram for malignant neoplasm of breast: Secondary | ICD-10-CM | POA: Diagnosis not present

## 2022-07-22 ENCOUNTER — Encounter: Payer: Self-pay | Admitting: Cardiology

## 2022-07-23 DIAGNOSIS — R5383 Other fatigue: Secondary | ICD-10-CM | POA: Diagnosis not present

## 2022-07-23 DIAGNOSIS — R635 Abnormal weight gain: Secondary | ICD-10-CM | POA: Diagnosis not present

## 2022-07-23 DIAGNOSIS — E039 Hypothyroidism, unspecified: Secondary | ICD-10-CM | POA: Diagnosis not present

## 2022-07-23 DIAGNOSIS — N959 Unspecified menopausal and perimenopausal disorder: Secondary | ICD-10-CM | POA: Diagnosis not present

## 2022-07-23 DIAGNOSIS — R232 Flushing: Secondary | ICD-10-CM | POA: Diagnosis not present

## 2022-07-23 DIAGNOSIS — G4709 Other insomnia: Secondary | ICD-10-CM | POA: Diagnosis not present

## 2022-07-23 DIAGNOSIS — M2550 Pain in unspecified joint: Secondary | ICD-10-CM | POA: Diagnosis not present

## 2022-07-23 DIAGNOSIS — M791 Myalgia, unspecified site: Secondary | ICD-10-CM | POA: Diagnosis not present

## 2022-08-05 DIAGNOSIS — J019 Acute sinusitis, unspecified: Secondary | ICD-10-CM | POA: Diagnosis not present

## 2022-08-13 DIAGNOSIS — N760 Acute vaginitis: Secondary | ICD-10-CM | POA: Diagnosis not present

## 2022-08-13 DIAGNOSIS — J309 Allergic rhinitis, unspecified: Secondary | ICD-10-CM | POA: Diagnosis not present

## 2022-08-13 DIAGNOSIS — Z6832 Body mass index (BMI) 32.0-32.9, adult: Secondary | ICD-10-CM | POA: Diagnosis not present

## 2022-08-13 DIAGNOSIS — E559 Vitamin D deficiency, unspecified: Secondary | ICD-10-CM | POA: Diagnosis not present

## 2022-08-18 DIAGNOSIS — F330 Major depressive disorder, recurrent, mild: Secondary | ICD-10-CM | POA: Diagnosis not present

## 2022-08-18 DIAGNOSIS — F419 Anxiety disorder, unspecified: Secondary | ICD-10-CM | POA: Diagnosis not present

## 2022-08-18 DIAGNOSIS — R41840 Attention and concentration deficit: Secondary | ICD-10-CM | POA: Diagnosis not present

## 2022-08-19 DIAGNOSIS — R635 Abnormal weight gain: Secondary | ICD-10-CM | POA: Diagnosis not present

## 2022-08-19 DIAGNOSIS — Z6832 Body mass index (BMI) 32.0-32.9, adult: Secondary | ICD-10-CM | POA: Diagnosis not present

## 2022-09-03 DIAGNOSIS — I10 Essential (primary) hypertension: Secondary | ICD-10-CM | POA: Diagnosis not present

## 2022-09-03 DIAGNOSIS — E039 Hypothyroidism, unspecified: Secondary | ICD-10-CM | POA: Diagnosis not present

## 2022-09-03 DIAGNOSIS — M129 Arthropathy, unspecified: Secondary | ICD-10-CM | POA: Diagnosis not present

## 2022-09-03 DIAGNOSIS — Z79899 Other long term (current) drug therapy: Secondary | ICD-10-CM | POA: Diagnosis not present

## 2022-09-03 DIAGNOSIS — Z6833 Body mass index (BMI) 33.0-33.9, adult: Secondary | ICD-10-CM | POA: Diagnosis not present

## 2022-09-03 DIAGNOSIS — F419 Anxiety disorder, unspecified: Secondary | ICD-10-CM | POA: Diagnosis not present

## 2022-09-03 DIAGNOSIS — F332 Major depressive disorder, recurrent severe without psychotic features: Secondary | ICD-10-CM | POA: Diagnosis not present

## 2022-09-05 ENCOUNTER — Other Ambulatory Visit: Payer: Self-pay | Admitting: Cardiology

## 2022-09-05 DIAGNOSIS — R0982 Postnasal drip: Secondary | ICD-10-CM | POA: Diagnosis not present

## 2022-09-05 DIAGNOSIS — J0110 Acute frontal sinusitis, unspecified: Secondary | ICD-10-CM | POA: Diagnosis not present

## 2022-09-10 DIAGNOSIS — N959 Unspecified menopausal and perimenopausal disorder: Secondary | ICD-10-CM | POA: Diagnosis not present

## 2022-09-10 DIAGNOSIS — R635 Abnormal weight gain: Secondary | ICD-10-CM | POA: Diagnosis not present

## 2022-09-10 DIAGNOSIS — R7309 Other abnormal glucose: Secondary | ICD-10-CM | POA: Diagnosis not present

## 2022-09-10 DIAGNOSIS — R6882 Decreased libido: Secondary | ICD-10-CM | POA: Diagnosis not present

## 2022-10-15 ENCOUNTER — Other Ambulatory Visit: Payer: Self-pay | Admitting: Cardiology

## 2022-10-15 NOTE — Telephone Encounter (Signed)
Rx refill sent to pharmacy. 

## 2022-11-03 DIAGNOSIS — R5383 Other fatigue: Secondary | ICD-10-CM | POA: Diagnosis not present

## 2022-11-03 DIAGNOSIS — N959 Unspecified menopausal and perimenopausal disorder: Secondary | ICD-10-CM | POA: Diagnosis not present

## 2022-11-03 DIAGNOSIS — R635 Abnormal weight gain: Secondary | ICD-10-CM | POA: Diagnosis not present

## 2022-11-03 DIAGNOSIS — E039 Hypothyroidism, unspecified: Secondary | ICD-10-CM | POA: Diagnosis not present

## 2022-11-10 DIAGNOSIS — R5383 Other fatigue: Secondary | ICD-10-CM | POA: Diagnosis not present

## 2022-11-10 DIAGNOSIS — F419 Anxiety disorder, unspecified: Secondary | ICD-10-CM | POA: Diagnosis not present

## 2022-11-10 DIAGNOSIS — R635 Abnormal weight gain: Secondary | ICD-10-CM | POA: Diagnosis not present

## 2022-11-10 DIAGNOSIS — N959 Unspecified menopausal and perimenopausal disorder: Secondary | ICD-10-CM | POA: Diagnosis not present

## 2022-11-12 ENCOUNTER — Other Ambulatory Visit: Payer: Self-pay | Admitting: Cardiology

## 2022-12-21 DIAGNOSIS — J019 Acute sinusitis, unspecified: Secondary | ICD-10-CM | POA: Diagnosis not present

## 2022-12-22 DIAGNOSIS — F419 Anxiety disorder, unspecified: Secondary | ICD-10-CM | POA: Diagnosis not present

## 2022-12-22 DIAGNOSIS — E669 Obesity, unspecified: Secondary | ICD-10-CM | POA: Diagnosis not present

## 2022-12-22 DIAGNOSIS — R635 Abnormal weight gain: Secondary | ICD-10-CM | POA: Diagnosis not present

## 2022-12-22 DIAGNOSIS — N959 Unspecified menopausal and perimenopausal disorder: Secondary | ICD-10-CM | POA: Diagnosis not present

## 2022-12-29 DIAGNOSIS — Z79899 Other long term (current) drug therapy: Secondary | ICD-10-CM | POA: Diagnosis not present

## 2022-12-29 DIAGNOSIS — E039 Hypothyroidism, unspecified: Secondary | ICD-10-CM | POA: Diagnosis not present

## 2022-12-29 DIAGNOSIS — I10 Essential (primary) hypertension: Secondary | ICD-10-CM | POA: Diagnosis not present

## 2022-12-29 DIAGNOSIS — F332 Major depressive disorder, recurrent severe without psychotic features: Secondary | ICD-10-CM | POA: Diagnosis not present

## 2022-12-29 DIAGNOSIS — Z6831 Body mass index (BMI) 31.0-31.9, adult: Secondary | ICD-10-CM | POA: Diagnosis not present

## 2022-12-29 DIAGNOSIS — F419 Anxiety disorder, unspecified: Secondary | ICD-10-CM | POA: Diagnosis not present

## 2022-12-29 DIAGNOSIS — R110 Nausea: Secondary | ICD-10-CM | POA: Diagnosis not present

## 2022-12-29 DIAGNOSIS — J309 Allergic rhinitis, unspecified: Secondary | ICD-10-CM | POA: Diagnosis not present

## 2022-12-31 DIAGNOSIS — I10 Essential (primary) hypertension: Secondary | ICD-10-CM | POA: Diagnosis not present

## 2022-12-31 DIAGNOSIS — E669 Obesity, unspecified: Secondary | ICD-10-CM | POA: Diagnosis not present

## 2022-12-31 DIAGNOSIS — Z6830 Body mass index (BMI) 30.0-30.9, adult: Secondary | ICD-10-CM | POA: Diagnosis not present

## 2022-12-31 DIAGNOSIS — R5383 Other fatigue: Secondary | ICD-10-CM | POA: Diagnosis not present

## 2023-01-05 IMAGING — DX DG CHEST 2V
2 series · 2 of 2 positions shown · non-contrast
Comparison: 12/05/2020

CLINICAL DATA: Dyspnea on exertion

EXAM:
CHEST - 2 VIEW

[chest pa]
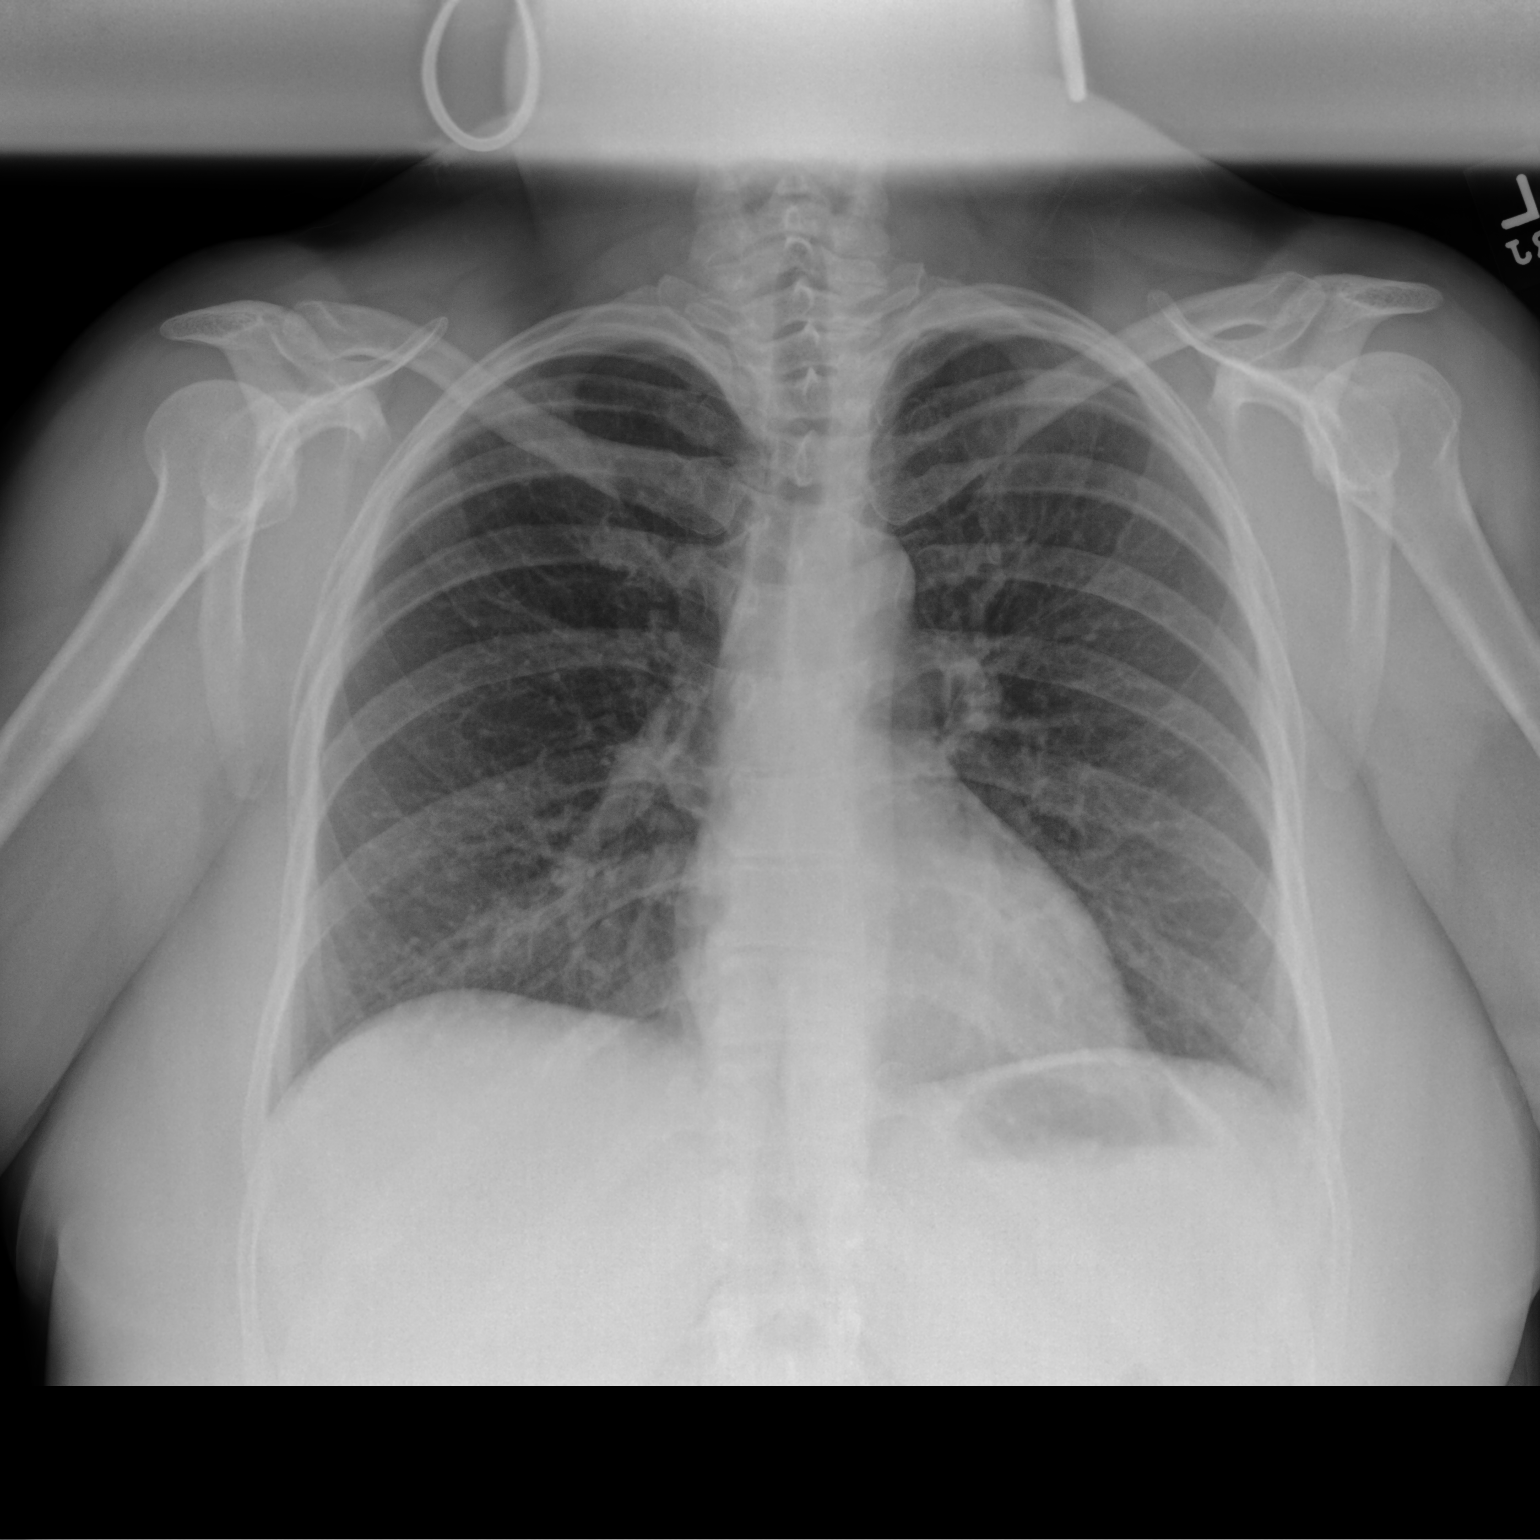

[chest lat]
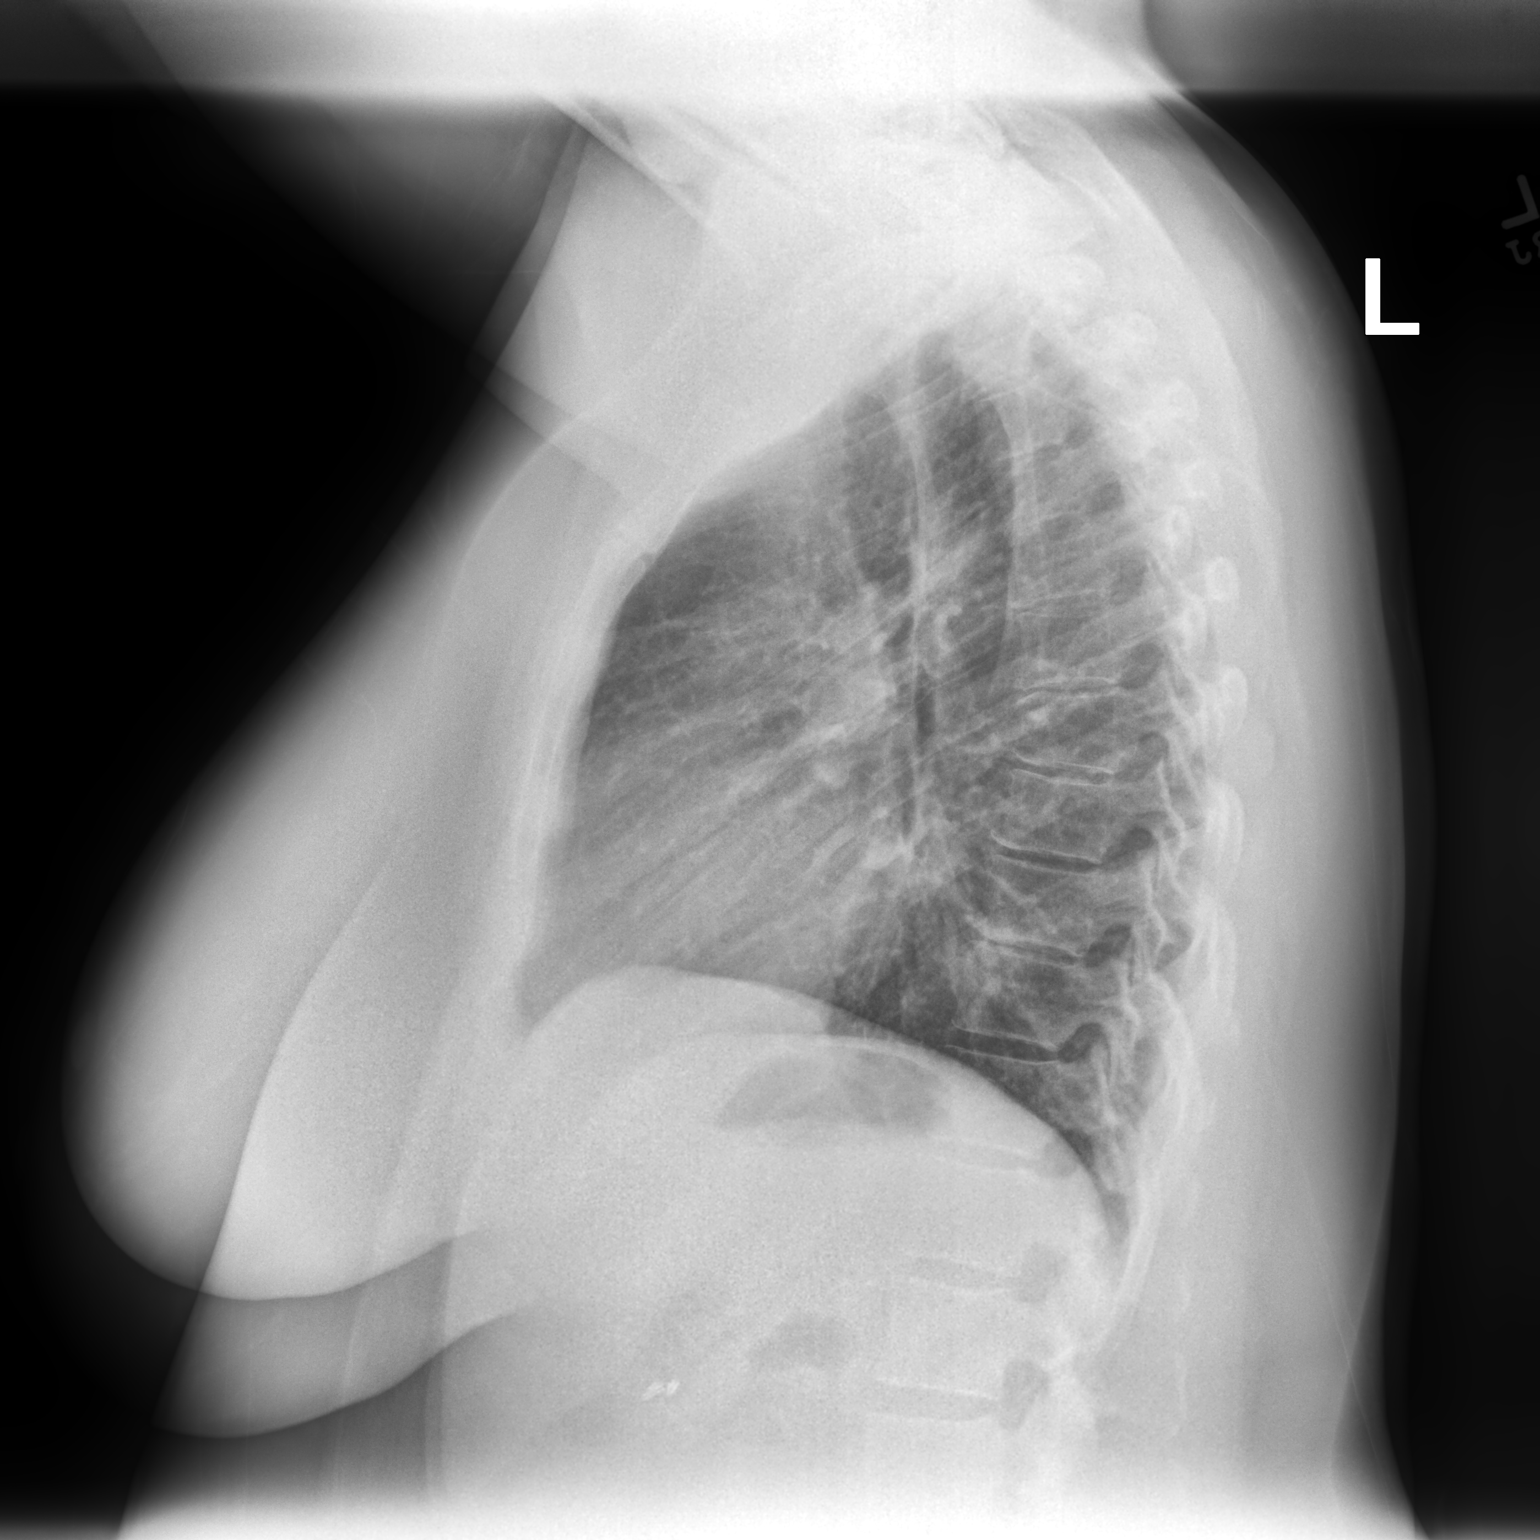

[2 of 2 positions shown; findings below may reference images not displayed]

FINDINGS: The heart size and mediastinal contours are within normal limits.
Both lungs are clear. The visualized skeletal structures are
unremarkable.
IMPRESSION: No active cardiopulmonary disease.

## 2023-01-09 DIAGNOSIS — Z90710 Acquired absence of both cervix and uterus: Secondary | ICD-10-CM | POA: Diagnosis not present

## 2023-01-09 DIAGNOSIS — K5792 Diverticulitis of intestine, part unspecified, without perforation or abscess without bleeding: Secondary | ICD-10-CM | POA: Diagnosis not present

## 2023-01-09 DIAGNOSIS — R109 Unspecified abdominal pain: Secondary | ICD-10-CM | POA: Diagnosis not present

## 2023-01-09 DIAGNOSIS — K5732 Diverticulitis of large intestine without perforation or abscess without bleeding: Secondary | ICD-10-CM | POA: Diagnosis not present

## 2023-01-09 DIAGNOSIS — R6883 Chills (without fever): Secondary | ICD-10-CM | POA: Diagnosis not present

## 2023-01-13 DIAGNOSIS — K5792 Diverticulitis of intestine, part unspecified, without perforation or abscess without bleeding: Secondary | ICD-10-CM | POA: Diagnosis not present

## 2023-01-13 DIAGNOSIS — Z09 Encounter for follow-up examination after completed treatment for conditions other than malignant neoplasm: Secondary | ICD-10-CM | POA: Diagnosis not present

## 2023-01-21 DIAGNOSIS — E039 Hypothyroidism, unspecified: Secondary | ICD-10-CM | POA: Diagnosis not present

## 2023-01-21 DIAGNOSIS — E669 Obesity, unspecified: Secondary | ICD-10-CM | POA: Diagnosis not present

## 2023-01-21 DIAGNOSIS — R5383 Other fatigue: Secondary | ICD-10-CM | POA: Diagnosis not present

## 2023-01-21 DIAGNOSIS — R232 Flushing: Secondary | ICD-10-CM | POA: Diagnosis not present

## 2023-01-27 DIAGNOSIS — R6884 Jaw pain: Secondary | ICD-10-CM | POA: Diagnosis not present

## 2023-01-27 DIAGNOSIS — M26609 Unspecified temporomandibular joint disorder, unspecified side: Secondary | ICD-10-CM | POA: Diagnosis not present

## 2023-01-27 DIAGNOSIS — M26602 Left temporomandibular joint disorder, unspecified: Secondary | ICD-10-CM | POA: Diagnosis not present

## 2023-01-27 DIAGNOSIS — R519 Headache, unspecified: Secondary | ICD-10-CM | POA: Diagnosis not present

## 2023-01-28 DIAGNOSIS — J019 Acute sinusitis, unspecified: Secondary | ICD-10-CM | POA: Diagnosis not present

## 2023-01-28 DIAGNOSIS — Z09 Encounter for follow-up examination after completed treatment for conditions other than malignant neoplasm: Secondary | ICD-10-CM | POA: Diagnosis not present

## 2023-01-28 DIAGNOSIS — M26622 Arthralgia of left temporomandibular joint: Secondary | ICD-10-CM | POA: Diagnosis not present

## 2023-01-28 DIAGNOSIS — Z6830 Body mass index (BMI) 30.0-30.9, adult: Secondary | ICD-10-CM | POA: Diagnosis not present

## 2023-02-02 DIAGNOSIS — M26629 Arthralgia of temporomandibular joint, unspecified side: Secondary | ICD-10-CM | POA: Diagnosis not present

## 2023-02-02 DIAGNOSIS — R5383 Other fatigue: Secondary | ICD-10-CM | POA: Diagnosis not present

## 2023-02-02 DIAGNOSIS — R232 Flushing: Secondary | ICD-10-CM | POA: Diagnosis not present

## 2023-02-02 DIAGNOSIS — R635 Abnormal weight gain: Secondary | ICD-10-CM | POA: Diagnosis not present

## 2023-02-02 DIAGNOSIS — K5792 Diverticulitis of intestine, part unspecified, without perforation or abscess without bleeding: Secondary | ICD-10-CM | POA: Diagnosis not present

## 2023-02-02 DIAGNOSIS — E669 Obesity, unspecified: Secondary | ICD-10-CM | POA: Diagnosis not present

## 2023-02-02 DIAGNOSIS — N959 Unspecified menopausal and perimenopausal disorder: Secondary | ICD-10-CM | POA: Diagnosis not present

## 2023-02-02 DIAGNOSIS — F419 Anxiety disorder, unspecified: Secondary | ICD-10-CM | POA: Diagnosis not present

## 2023-02-11 ENCOUNTER — Telehealth: Payer: Self-pay

## 2023-02-11 NOTE — Telephone Encounter (Signed)
Transition Care Management Follow-up Telephone Call Date of discharge and from where: [facility] 8/28 How have you been since you were released from the hospital? Doing much better and has followed up with PCP Any questions or concerns? No  Items Reviewed: Did the pt receive and understand the discharge instructions provided? Yes Medications obtained and verified? Yes  Other? No  Any new allergies since your discharge? No  Dietary orders reviewed? No Do you have support at home? Yes     Follow up appointments reviewed:  PCP Hospital f/u appt confirmed? Yes  Scheduled to see PCP on  @ . Specialist Hospital f/u appt confirmed? No  Scheduled to see  on  @ . Are transportation arrangements needed? No If their condition worsens, is the pt aware to call PCP or go to the Emergency Dept.? Yes Was the patient provided with contact information for the PCP's office or ED? Yes Was to pt encouraged to call back with questions or concerns? Yes

## 2023-02-12 ENCOUNTER — Ambulatory Visit: Payer: 59 | Admitting: Internal Medicine

## 2023-03-11 DIAGNOSIS — R051 Acute cough: Secondary | ICD-10-CM | POA: Diagnosis not present

## 2023-03-11 DIAGNOSIS — R519 Headache, unspecified: Secondary | ICD-10-CM | POA: Diagnosis not present

## 2023-03-11 DIAGNOSIS — R0981 Nasal congestion: Secondary | ICD-10-CM | POA: Diagnosis not present

## 2023-04-13 DIAGNOSIS — E039 Hypothyroidism, unspecified: Secondary | ICD-10-CM | POA: Diagnosis not present

## 2023-04-13 DIAGNOSIS — R110 Nausea: Secondary | ICD-10-CM | POA: Diagnosis not present

## 2023-04-13 DIAGNOSIS — F332 Major depressive disorder, recurrent severe without psychotic features: Secondary | ICD-10-CM | POA: Diagnosis not present

## 2023-04-13 DIAGNOSIS — E559 Vitamin D deficiency, unspecified: Secondary | ICD-10-CM | POA: Diagnosis not present

## 2023-04-13 DIAGNOSIS — Z79899 Other long term (current) drug therapy: Secondary | ICD-10-CM | POA: Diagnosis not present

## 2023-04-13 DIAGNOSIS — I10 Essential (primary) hypertension: Secondary | ICD-10-CM | POA: Diagnosis not present

## 2023-04-13 DIAGNOSIS — F419 Anxiety disorder, unspecified: Secondary | ICD-10-CM | POA: Diagnosis not present

## 2023-04-13 DIAGNOSIS — J309 Allergic rhinitis, unspecified: Secondary | ICD-10-CM | POA: Diagnosis not present

## 2023-04-13 DIAGNOSIS — Z6831 Body mass index (BMI) 31.0-31.9, adult: Secondary | ICD-10-CM | POA: Diagnosis not present

## 2023-04-13 DIAGNOSIS — D72829 Elevated white blood cell count, unspecified: Secondary | ICD-10-CM | POA: Diagnosis not present

## 2023-05-09 ENCOUNTER — Other Ambulatory Visit: Payer: Self-pay | Admitting: Cardiology

## 2023-05-11 ENCOUNTER — Ambulatory Visit: Payer: 59 | Admitting: Cardiology

## 2023-05-11 DIAGNOSIS — N951 Menopausal and female climacteric states: Secondary | ICD-10-CM | POA: Diagnosis not present

## 2023-05-11 DIAGNOSIS — Z6832 Body mass index (BMI) 32.0-32.9, adult: Secondary | ICD-10-CM | POA: Diagnosis not present

## 2023-05-11 DIAGNOSIS — N898 Other specified noninflammatory disorders of vagina: Secondary | ICD-10-CM | POA: Diagnosis not present

## 2023-05-11 DIAGNOSIS — I10 Essential (primary) hypertension: Secondary | ICD-10-CM | POA: Diagnosis not present

## 2023-05-11 DIAGNOSIS — E785 Hyperlipidemia, unspecified: Secondary | ICD-10-CM | POA: Diagnosis not present

## 2023-05-11 DIAGNOSIS — R5383 Other fatigue: Secondary | ICD-10-CM | POA: Diagnosis not present

## 2023-05-11 DIAGNOSIS — E039 Hypothyroidism, unspecified: Secondary | ICD-10-CM | POA: Diagnosis not present

## 2023-05-11 DIAGNOSIS — F339 Major depressive disorder, recurrent, unspecified: Secondary | ICD-10-CM | POA: Diagnosis not present

## 2023-05-11 DIAGNOSIS — R232 Flushing: Secondary | ICD-10-CM | POA: Diagnosis not present

## 2023-05-11 DIAGNOSIS — F419 Anxiety disorder, unspecified: Secondary | ICD-10-CM | POA: Diagnosis not present

## 2023-05-11 DIAGNOSIS — R6882 Decreased libido: Secondary | ICD-10-CM | POA: Diagnosis not present

## 2023-05-11 DIAGNOSIS — J302 Other seasonal allergic rhinitis: Secondary | ICD-10-CM | POA: Diagnosis not present

## 2023-06-02 ENCOUNTER — Other Ambulatory Visit: Payer: Self-pay | Admitting: Cardiology

## 2023-07-03 ENCOUNTER — Other Ambulatory Visit: Payer: Self-pay | Admitting: Cardiology

## 2023-07-12 DIAGNOSIS — M791 Myalgia, unspecified site: Secondary | ICD-10-CM | POA: Diagnosis not present

## 2023-07-12 DIAGNOSIS — R051 Acute cough: Secondary | ICD-10-CM | POA: Diagnosis not present

## 2023-07-12 DIAGNOSIS — R112 Nausea with vomiting, unspecified: Secondary | ICD-10-CM | POA: Diagnosis not present

## 2023-07-15 ENCOUNTER — Other Ambulatory Visit: Payer: Self-pay | Admitting: Cardiology

## 2023-07-20 DIAGNOSIS — E039 Hypothyroidism, unspecified: Secondary | ICD-10-CM | POA: Diagnosis not present

## 2023-07-20 DIAGNOSIS — N951 Menopausal and female climacteric states: Secondary | ICD-10-CM | POA: Diagnosis not present

## 2023-07-25 ENCOUNTER — Encounter (HOSPITAL_BASED_OUTPATIENT_CLINIC_OR_DEPARTMENT_OTHER): Payer: Self-pay

## 2023-07-25 ENCOUNTER — Ambulatory Visit (HOSPITAL_BASED_OUTPATIENT_CLINIC_OR_DEPARTMENT_OTHER)
Admission: RE | Admit: 2023-07-25 | Discharge: 2023-07-25 | Disposition: A | Discharge status: Home / Self Care | Payer: BC Managed Care – PPO | Source: Ambulatory Visit | Attending: Family Medicine | Admitting: Family Medicine

## 2023-07-25 VITALS — BP 121/83 | HR 81 | Temp 98.2°F | Resp 18 | Ht 61.0 in | Wt 180.0 lb

## 2023-07-25 DIAGNOSIS — R051 Acute cough: Secondary | ICD-10-CM | POA: Diagnosis not present

## 2023-07-25 DIAGNOSIS — R509 Fever, unspecified: Secondary | ICD-10-CM

## 2023-07-25 DIAGNOSIS — J0140 Acute pansinusitis, unspecified: Secondary | ICD-10-CM | POA: Diagnosis not present

## 2023-07-25 LAB — POC COVID19/FLU A&B COMBO
Covid Antigen, POC: NEGATIVE
Influenza A Antigen, POC: NEGATIVE
Influenza B Antigen, POC: NEGATIVE

## 2023-07-25 MED ORDER — TRIAMCINOLONE ACETONIDE 40 MG/ML IJ SUSP
40.0000 mg | Freq: Once | INTRAMUSCULAR | Status: AC
  Administered 2023-07-25: 40 mg via INTRAMUSCULAR

## 2023-07-25 MED ORDER — CEFDINIR 300 MG PO CAPS: 300.0000 mg | ORAL_CAPSULE | Freq: Two times a day (BID) | ORAL | 0 refills | Status: AC

## 2023-07-25 NOTE — Discharge Instructions (Addendum)
 Acute pansinusitis: Kenalog 40 mg injection during the visit today (this is a steroid injection).  Cefdinir, 300 mg, twice daily for 7 days.  Encouraged use of sinus rinses.  Get plenty of fluids and rest.  Negative for influenza and COVID.  Work excuse provided.  Follow-up if symptoms do not improve, worsen or new symptoms occur.

## 2023-07-25 NOTE — ED Provider Notes (Signed)
 [facility]    CSN: [id number] Arrival date & time: 07/25/23  1403      History   Chief Complaint Chief Complaint  Patient presents with   Fatigue    Sinus and flu type symptoms - Entered by patient    HPI Elizabeth Montoya is a 48 y.o. female.   Patient works in [employer] care at [employer].  She started feeling sick on 07/09/2023 and was diagnosed with influenza type a on 07/12/2023.  She got better and then yesterday (07/24/23) she all of a sudden felt sick.  She is reporting extreme fatigue, low grade fever, sinus pressure and sore throat yesterday.  Patient has taken Nyquil and Dual Action Advil.  A little more       Past Medical History:  Diagnosis Date   Anxiety    Constipation, chronic    Fatigue    GERD (gastroesophageal reflux disease)    Hormone replacement therapy    Menopausal and postmenopausal disorder 02/12/2021   Menopause 02/12/2021   Mild obstructive sleep apnea 04/17/2021   Mixed dyslipidemia 02/14/2021   Mood swings    Obesity (BMI 30-39.9) 02/06/2021   Overweight    Persistent indigestion    Primary hypertension 02/06/2021   Seasonal allergies    TMJ arthralgia     Patient Active Problem List   Diagnosis Date Noted   Leukocytosis 06/30/2022   Mild obstructive sleep apnea 04/17/2021   Mixed dyslipidemia 02/14/2021   Menopausal and postmenopausal disorder 02/12/2021   Menopause 02/12/2021   Primary hypertension 02/06/2021   Obesity (BMI 30-39.9) 02/06/2021   Anxiety 01/27/2021   Constipation, chronic 01/27/2021   Fatigue 01/27/2021   Hormone replacement therapy 01/27/2021   Mood swings 01/27/2021   Overweight 01/27/2021   Persistent indigestion 01/27/2021   Seasonal allergies 01/27/2021   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      Home Medications    Prior to Admission medications   Medication Sig Start Date End Date Taking? Authorizing Provider  albuterol (VENTOLIN HFA) 108 (90 Base) MCG/ACT inhaler Inhale 2 puffs into the lungs every 6 (six) hours as needed. 06/26/22  Yes [provider]  amLODipine (NORVASC) 5 MG tablet Take 1.5 tablets (7.5 mg total) by mouth daily. 07/15/23  Yes Revankar, Aundra Dubin, MD  baclofen (LIORESAL) 10 MG tablet Take 10 mg by mouth 2 (two) times daily as needed for muscle spasms. 04/01/21  Yes [provider]  budesonide-formoterol (SYMBICORT) 160-4.5 MCG/ACT inhaler Inhale 2 puffs into the lungs every 6 (six) hours as needed (wheezing or shortness of breath). 05/22/22  Yes [provider]  buPROPion (WELLBUTRIN XL) 300 MG 24 hr tablet Take 300 mg by mouth daily. 06/06/22  Yes [provider]  cefdinir (OMNICEF) 300 MG capsule Take 1 capsule (300 mg total) by mouth 2 (two) times daily for 7 days. 07/25/23 08/01/23 Yes Prescilla Sours, FNP  celecoxib (CELEBREX) 200 MG capsule Take 200 mg by mouth daily as needed. 06/09/23  Yes [provider]  cloNIDine (CATAPRES) 0.1 MG tablet Take 0.1 mg by mouth 2 (two) times daily. 06/11/22  Yes [provider]  dicyclomine (BENTYL) 10 MG capsule Take 10 mg by mouth every 6 (six) hours as needed.  06/23/23  Yes [provider]  fluticasone (FLONASE) 50 MCG/ACT nasal spray Place 1-2 sprays into both nostrils 2 (two) times daily. 06/12/22  Yes [provider]  furosemide (LASIX) 20 MG tablet Take 20 mg by mouth 2 (two) times daily. 07/05/23  Yes [provider]  levothyroxine (SYNTHROID) 75 MCG tablet Take 75 mcg by mouth every morning. 06/26/22  Yes [provider]  liothyronine (CYTOMEL) 5 MCG tablet Take 5 mcg by mouth every morning. 06/26/22  Yes [provider]  montelukast (SINGULAIR) 10 MG tablet Take 1 tablet by mouth at bedtime. 08/28/20  Yes [provider]  omeprazole (PRILOSEC) 20 MG capsule Take 1 capsule by mouth every morning. 09/26/20  Yes [provider]  ondansetron (ZOFRAN) 4 MG tablet Take 4 mg by mouth every 12 (twelve) hours. 06/23/23  Yes [provider]  progesterone (PROMETRIUM) 200 MG capsule Take 200 mg by mouth at bedtime. 07/04/23  Yes [provider]  propranolol (INDERAL) 20 MG tablet Take 1 tablet (20 mg total) by mouth 2 (two) times daily. Patient needs appointment for further refills. 1 st attempt 07/05/23  Yes Revankar, Aundra Dubin, MD  rosuvastatin (CRESTOR) 10 MG tablet Take 1 tablet (10 mg total) by mouth daily. Patient needs appointment for further refills. 1 st attempt 07/05/23  Yes Revankar, Aundra Dubin, MD  venlafaxine XR (EFFEXOR-XR) 75 MG 24 hr capsule Take 75 mg by mouth daily. 06/30/22  Yes [provider]  ALLERGY RELIEF 10 MG tablet Take 10 mg by mouth daily. 10/23/20   [provider]  fenofibrate (TRICOR) 145 MG tablet Take 145 mg by mouth at bedtime. 04/12/23   [provider]  ketorolac (TORADOL) 10 MG tablet Take 10 mg by mouth 2 (two) times daily. 02/02/23   [provider]  phentermine (ADIPEX-P) 37.5 MG tablet Take 37.5 mg by mouth daily. 01/30/21   [provider]  traMADol (ULTRAM) 50 MG tablet Take 50 mg by mouth every 4 (four) hours as needed. 01/27/23   [provider]  Vitamin D, Ergocalciferol, (DRISDOL) 1.25 MG (50000 UNIT) CAPS capsule Take 50,000 Units by mouth every 30 (thirty) days. 06/10/22   [provider]    Family History Family History  Problem Relation Age of Onset   Heart failure Mother    Asthma Mother    Seizures Mother    Thyroid disease Mother    Hypertension Father    Arthritis Father    Anemia Father    Asthma Father    Stroke Father    Heart disease Father    Hypercholesterolemia Father    Heart disease Paternal Grandmother    Hypercholesterolemia Paternal Grandmother    Colon cancer  Paternal Grandmother    Lung cancer Paternal Grandfather    Breast cancer Paternal Aunt     Social History Social History   Tobacco Use   Smoking status: Former    Current packs/day: 0.00    Average packs/day: 0.5 packs/day for 2.0 years (1.0 ttl pk-yrs)    Types: Cigarettes    Start date: 06/02/2015    Quit date: 06/01/2017    Years since quitting: 6.1   Smokeless tobacco: Never  Vaping Use   Vaping status: Never Used  Substance Use Topics   Alcohol use: Not Currently   Drug use: Not Currently     Allergies   Codeine   Review of Systems Review of Systems  Constitutional:  Positive for fever. Negative for chills.  HENT:  Positive for congestion, postnasal drip, rhinorrhea, sinus pressure, sinus pain and sore throat. Negative for ear pain.   Eyes:  Negative for pain and visual disturbance.  Respiratory:  Positive for cough. Negative for shortness of breath.   Cardiovascular:  Negative for chest pain and palpitations.  Gastrointestinal:  Negative for abdominal pain, constipation, diarrhea, nausea and vomiting.  Genitourinary:  Negative for dysuria and hematuria.  Musculoskeletal:  Positive for arthralgias. Negative for back pain.  Skin:  Negative for color change and rash.  Neurological:  Positive for dizziness and headaches. Negative for seizures and syncope.  All other systems reviewed and are negative.    Physical Exam Triage Vital Signs ED Triage Vitals  Encounter Vitals Group     BP 07/25/23 1421 121/83     Systolic BP Percentile --      Diastolic BP Percentile --      Pulse Rate 07/25/23 1421 81     Resp 07/25/23 1421 18     Temp 07/25/23 1421 98.2 F (36.8 C)     Temp Source 07/25/23 1421 Oral     SpO2 07/25/23 1421 97 %     Weight 07/25/23 1422 180 lb (81.6 kg)     Height 07/25/23 1422 5\' 1"  (1.549 m)     Head Circumference --      Peak Flow --      Pain Score 07/25/23 1422 0     Pain Loc --      Pain Education --      Exclude from Growth Chart --     No data found.  Updated Vital Signs BP 121/83 (BP Location: Right Arm)   Pulse 81   Temp 98.2 F (36.8 C) (Oral)   Resp 18   Ht 5\' 1"  (1.549 m)   Wt 180 lb (81.6 kg)   SpO2 97%   BMI 34.01 kg/m   Visual Acuity Right Eye Distance:   Left Eye Distance:   Bilateral Distance:    Right Eye Near:   Left Eye Near:    Bilateral Near:     Physical Exam Vitals and nursing note reviewed.  Constitutional:      General: She is not in acute distress.    Appearance: She is well-developed. She is ill-appearing. She is not toxic-appearing.  HENT:     Head: Normocephalic and atraumatic.     Right Ear: Hearing, tympanic membrane, ear canal and external ear normal.     Left Ear: Hearing, tympanic membrane, ear canal and external ear normal.     Nose: Congestion and rhinorrhea present. Rhinorrhea is clear.     Right Sinus: Maxillary sinus tenderness and frontal sinus tenderness present.     Left Sinus: Maxillary sinus tenderness and frontal sinus tenderness present.     Mouth/Throat:     Lips: Pink.     Mouth: Mucous membranes are moist.     Pharynx: Uvula midline. No oropharyngeal exudate or posterior oropharyngeal erythema.     Tonsils: No tonsillar exudate.  Eyes:     Conjunctiva/sclera: Conjunctivae normal.     Pupils: Pupils are equal, round, and reactive to light.  Cardiovascular:     Rate and Rhythm: Normal rate and regular rhythm.     Heart sounds: S1 normal and S2 normal. No murmur heard. Pulmonary:     Effort: Pulmonary effort is normal. No respiratory distress.     Breath sounds: Normal breath sounds. No decreased breath sounds, wheezing, rhonchi or rales.  Abdominal:  General: Bowel sounds are normal.     Palpations: Abdomen is soft.     Tenderness: There is no abdominal tenderness.  Musculoskeletal:        General: No swelling.     Cervical back: Neck supple.  Lymphadenopathy:     Head:     Right side of head: No submental, submandibular, tonsillar,  preauricular or posterior auricular adenopathy.     Left side of head: No submental, submandibular, tonsillar, preauricular or posterior auricular adenopathy.     Cervical: No cervical adenopathy.     Right cervical: No superficial cervical adenopathy.    Left cervical: No superficial cervical adenopathy.  Skin:    General: Skin is warm and dry.     Capillary Refill: Capillary refill takes less than 2 seconds.     Findings: No rash.  Neurological:     Mental Status: She is alert and oriented to person, place, and time.  Psychiatric:        Mood and Affect: Mood normal.   What sounds like a Drug I probably here   UC Treatments / Results  Labs (all labs ordered are listed, but only abnormal results are displayed) Labs Reviewed  POC COVID19/FLU A&B COMBO - Normal    EKG   Radiology No results found.  Procedures Procedures (including critical care time)  Medications Ordered in UC Medications  triamcinolone acetonide (KENALOG-40) injection 40 mg (40 mg Intramuscular Given 07/25/23 1526)    Initial Impression / Assessment and Plan / UC Course  I have reviewed the triage vital signs and the nursing notes.  Pertinent labs & imaging results that were available during my care of the patient were reviewed by me and considered in my medical decision making (see chart for details).     Negative for flu and COVID.  Will treat for pansinusitis.  Kenalog 40 mg, IM now.  Cefdinir, 300 mg, twice daily for 7 days.  Get plenty of fluids and rest.  Work excuse provided.  Follow-up if symptoms do not improve, worsen or new symptoms occur. Final Clinical Impressions(s) / UC Diagnoses   Final diagnoses:  Acute cough  Fever, unspecified  Acute non-recurrent pansinusitis     Discharge Instructions      Acute pansinusitis: Kenalog 40 mg injection during the visit today (this is a steroid injection).  Cefdinir, 300 mg, twice daily for 7 days.  Encouraged use of sinus rinses.  Get  plenty of fluids and rest.  Negative for influenza and COVID.  Work excuse provided.  Follow-up if symptoms do not improve, worsen or new symptoms occur.     ED Prescriptions     Medication Sig Dispense Auth. Provider   cefdinir (OMNICEF) 300 MG capsule Take 1 capsule (300 mg total) by mouth 2 (two) times daily for 7 days. 14 capsule Prescilla Sours, FNP      PDMP not reviewed this encounter.   Prescilla Sours, FNP 07/25/23 [phone]

## 2023-07-25 NOTE — ED Triage Notes (Signed)
 Patient states that she tested positive for the flu on 07/12/2023.  Started feeling better, went back to work for the week then yesterday "she bottomed out."  Extreme fatigue, low grade fever, sinus pressure and sore throat yesterday.  Patient has taken Nyquil and Dual Action Advil.

## 2023-07-28 ENCOUNTER — Other Ambulatory Visit: Payer: Self-pay | Admitting: Cardiology

## 2023-07-28 NOTE — Telephone Encounter (Signed)
 Rx refill sent to pharmacy.

## 2023-07-29 DIAGNOSIS — N951 Menopausal and female climacteric states: Secondary | ICD-10-CM | POA: Diagnosis not present

## 2023-07-29 DIAGNOSIS — F339 Major depressive disorder, recurrent, unspecified: Secondary | ICD-10-CM | POA: Diagnosis not present

## 2023-07-29 DIAGNOSIS — E039 Hypothyroidism, unspecified: Secondary | ICD-10-CM | POA: Diagnosis not present

## 2023-07-29 DIAGNOSIS — I10 Essential (primary) hypertension: Secondary | ICD-10-CM | POA: Diagnosis not present

## 2023-08-07 ENCOUNTER — Other Ambulatory Visit: Payer: Self-pay | Admitting: Cardiology

## 2023-08-11 ENCOUNTER — Other Ambulatory Visit: Payer: Self-pay | Admitting: Cardiology

## 2023-08-22 ENCOUNTER — Other Ambulatory Visit: Payer: Self-pay | Admitting: Cardiology

## 2023-08-23 DIAGNOSIS — E039 Hypothyroidism, unspecified: Secondary | ICD-10-CM | POA: Diagnosis not present

## 2023-08-23 DIAGNOSIS — F332 Major depressive disorder, recurrent severe without psychotic features: Secondary | ICD-10-CM | POA: Diagnosis not present

## 2023-08-23 DIAGNOSIS — F419 Anxiety disorder, unspecified: Secondary | ICD-10-CM | POA: Diagnosis not present

## 2023-08-23 DIAGNOSIS — I10 Essential (primary) hypertension: Secondary | ICD-10-CM | POA: Diagnosis not present

## 2023-08-26 ENCOUNTER — Other Ambulatory Visit: Payer: Self-pay | Admitting: Cardiology

## 2023-08-28 ENCOUNTER — Other Ambulatory Visit: Payer: Self-pay | Admitting: Cardiology

## 2023-08-30 NOTE — Telephone Encounter (Signed)
 Prescription sent to pharmacy.

## 2023-09-16 ENCOUNTER — Other Ambulatory Visit: Payer: Self-pay | Admitting: Cardiology

## 2023-09-20 ENCOUNTER — Other Ambulatory Visit: Payer: Self-pay | Admitting: Cardiology

## 2023-10-30 DIAGNOSIS — K5732 Diverticulitis of large intestine without perforation or abscess without bleeding: Secondary | ICD-10-CM | POA: Diagnosis not present

## 2023-10-30 DIAGNOSIS — R1031 Right lower quadrant pain: Secondary | ICD-10-CM | POA: Diagnosis not present

## 2023-11-04 DIAGNOSIS — K5792 Diverticulitis of intestine, part unspecified, without perforation or abscess without bleeding: Secondary | ICD-10-CM | POA: Diagnosis not present

## 2023-11-15 DIAGNOSIS — R109 Unspecified abdominal pain: Secondary | ICD-10-CM | POA: Diagnosis not present

## 2023-11-15 DIAGNOSIS — Z79899 Other long term (current) drug therapy: Secondary | ICD-10-CM | POA: Diagnosis not present

## 2023-11-15 DIAGNOSIS — R1030 Lower abdominal pain, unspecified: Secondary | ICD-10-CM | POA: Diagnosis not present

## 2023-11-15 DIAGNOSIS — K921 Melena: Secondary | ICD-10-CM | POA: Diagnosis not present

## 2023-11-15 DIAGNOSIS — R10819 Abdominal tenderness, unspecified site: Secondary | ICD-10-CM | POA: Diagnosis not present

## 2023-11-15 DIAGNOSIS — Z8719 Personal history of other diseases of the digestive system: Secondary | ICD-10-CM | POA: Diagnosis not present

## 2023-11-15 DIAGNOSIS — D72829 Elevated white blood cell count, unspecified: Secondary | ICD-10-CM | POA: Diagnosis not present

## 2023-11-18 DIAGNOSIS — K5909 Other constipation: Secondary | ICD-10-CM | POA: Diagnosis not present

## 2023-12-27 ENCOUNTER — Encounter: Payer: Self-pay | Admitting: Internal Medicine

## 2023-12-27 ENCOUNTER — Ambulatory Visit: Admitting: Internal Medicine

## 2023-12-27 VITALS — BP 124/80 | HR 92 | Temp 98.1°F | Resp 18 | Ht 61.0 in | Wt 187.0 lb

## 2023-12-27 DIAGNOSIS — F3341 Major depressive disorder, recurrent, in partial remission: Secondary | ICD-10-CM | POA: Insufficient documentation

## 2023-12-27 DIAGNOSIS — I10 Essential (primary) hypertension: Secondary | ICD-10-CM | POA: Diagnosis not present

## 2023-12-27 DIAGNOSIS — N959 Unspecified menopausal and perimenopausal disorder: Secondary | ICD-10-CM

## 2023-12-27 DIAGNOSIS — M1990 Unspecified osteoarthritis, unspecified site: Secondary | ICD-10-CM | POA: Diagnosis not present

## 2023-12-27 DIAGNOSIS — E782 Mixed hyperlipidemia: Secondary | ICD-10-CM

## 2023-12-27 DIAGNOSIS — K5792 Diverticulitis of intestine, part unspecified, without perforation or abscess without bleeding: Secondary | ICD-10-CM | POA: Insufficient documentation

## 2023-12-27 DIAGNOSIS — Z6835 Body mass index (BMI) 35.0-35.9, adult: Secondary | ICD-10-CM

## 2023-12-27 MED ORDER — CLONIDINE HCL 0.1 MG PO TABS: 0.1000 mg | ORAL_TABLET | Freq: Two times a day (BID) | ORAL | 3 refills | Status: DC

## 2023-12-27 MED ORDER — CARIPRAZINE HCL 1.5 MG PO CAPS: 1.5000 mg | ORAL_CAPSULE | Freq: Every day | ORAL | 1 refills | Status: DC

## 2023-12-27 NOTE — Progress Notes (Unsigned)
 New Patient Office Visit  Subjective    Patient ID: Elizabeth Montoya, female    DOB: 03/01/1976  Age: 48 y.o. MRN: [id number]  CC:  Chief Complaint  Patient presents with   New Patient (Initial Visit)    HPI Sharron Keel presents to establish care with us . She says she is depress, she take bupropion 200 mg twice a day.  She was also taking venlafaxine but she could not get a refill and she has stopped that few months ago.  She said that her previous primary care doctor was part-time and was not available for her when she needed so that she decided to switch that to us .  She said that her biggest problem is her depression.  She does not enjoy her life.  She does not go out and meet people.  She stayed tired all the time.  She is gaining weight per patient.  She denies having any suicidal or homicidal ideation.  She also has hypertension.  I have reviewed her medications with her.  She was taking propranolol  and clonidine  0.1 mg twice a day.  She said that she has stopped taking propranolol .  She needed refill of clonidine .  She also was taking amlodipine  but she has stopped taking that.  She has a history of asthma but she only takes singular 10 mg daily.  She has hypothyroidism and she takes levothyroxine 75 mcg daily.  She says that she is still tired and gaining weight.  She does not remember when was her last TSH done.  She also has a postmenopausal vasomotor symptoms and getting hot flashes.  She has been taking hormone replacement.  This has helped her a lot per patient.  She also has a history of hyperlipidemia and she takes rosuvastatin  10 mg daily.  She said that she has diverticulitis and she will see Dr. Larene in we will have colonoscopy scheduled for evaluation.  She is also complaining of arthritis in her both hands particularly both thumbs both hips and knees and she is wondering if this has some relationship with her diverticulitis.  She takes Celebrex but wanted  to switch that to meloxicam.  She said that both thumbs hurt so much and it is stiff in the morning.  She is not sure what kind of investigation was done for her arthritis.   Outpatient Encounter Medications as of 12/27/2023  Medication Sig   albuterol (VENTOLIN HFA) 108 (90 Base) MCG/ACT inhaler Inhale 2 puffs into the lungs every 6 (six) hours as needed.   ALLERGY RELIEF 10 MG tablet Take 10 mg by mouth daily.   buPROPion (WELLBUTRIN XL) 300 MG 24 hr tablet Take 300 mg by mouth daily.   celecoxib (CELEBREX) 200 MG capsule Take 200 mg by mouth daily as needed.   cloNIDine  (CATAPRES ) 0.1 MG tablet Take 0.1 mg by mouth 2 (two) times daily.   dicyclomine (BENTYL) 10 MG capsule Take 10 mg by mouth every 6 (six) hours as needed.   furosemide (LASIX) 20 MG tablet Take 20 mg by mouth 2 (two) times daily.   levothyroxine (SYNTHROID) 75 MCG tablet Take 75 mcg by mouth every morning.   liothyronine (CYTOMEL) 5 MCG tablet Take 5 mcg by mouth every morning.   montelukast (SINGULAIR) 10 MG tablet Take 1 tablet by mouth at bedtime.   omeprazole (PRILOSEC) 20 MG capsule Take 1 capsule by mouth every morning.   ondansetron  (ZOFRAN ) 4 MG tablet Take 4 mg by mouth every 12 (twelve) hours.  progesterone  (PROMETRIUM ) 200 MG capsule Take 200 mg by mouth at bedtime.   propranolol  (INDERAL ) 20 MG tablet Take 1 tablet (20 mg total) by mouth 2 (two) times daily. Pt needs office visit before future refills. 3 rd/final attempt   amLODipine  (NORVASC ) 5 MG tablet Take 1.5 tablets (7.5 mg total) by mouth daily. Patient needs to make an appointment for further refills. 3rd and final attempt. (Patient not taking: Reported on 12/27/2023)   baclofen (LIORESAL) 10 MG tablet Take 10 mg by mouth 2 (two) times daily as needed for muscle spasms. (Patient not taking: Reported on 12/27/2023)   budesonide-formoterol (SYMBICORT) 160-4.5 MCG/ACT inhaler Inhale 2 puffs into the lungs every 6 (six) hours as needed (wheezing or shortness of  breath). (Patient not taking: Reported on 12/27/2023)   fenofibrate (TRICOR) 145 MG tablet Take 145 mg by mouth at bedtime. (Patient not taking: Reported on 12/27/2023)   fluticasone (FLONASE) 50 MCG/ACT nasal spray Place 1-2 sprays into both nostrils 2 (two) times daily. (Patient not taking: Reported on 12/27/2023)   ketorolac  (TORADOL ) 10 MG tablet Take 10 mg by mouth 2 (two) times daily. (Patient not taking: Reported on 12/27/2023)   phentermine (ADIPEX-P) 37.5 MG tablet Take 37.5 mg by mouth daily. (Patient not taking: Reported on 12/27/2023)   rosuvastatin  (CRESTOR ) 10 MG tablet Take 1 tablet (10 mg total) by mouth daily. Pt needs office visit before future refills. 3 rd/final attempt (Patient not taking: Reported on 12/27/2023)   traMADol (ULTRAM) 50 MG tablet Take 50 mg by mouth every 4 (four) hours as needed. (Patient not taking: Reported on 12/27/2023)   venlafaxine XR (EFFEXOR-XR) 75 MG 24 hr capsule Take 75 mg by mouth daily. (Patient not taking: Reported on 12/27/2023)   Vitamin D, Ergocalciferol, (DRISDOL) 1.25 MG (50000 UNIT) CAPS capsule Take 50,000 Units by mouth every 30 (thirty) days. (Patient not taking: Reported on 12/27/2023)   No facility-administered encounter medications on file as of 12/27/2023.    Past Medical History:  Diagnosis Date   Anxiety    Constipation, chronic    Fatigue    GERD (gastroesophageal reflux disease)    Hormone replacement therapy    Menopausal and postmenopausal disorder 02/12/2021   Menopause 02/12/2021   Mild obstructive sleep apnea 04/17/2021   Mixed dyslipidemia 02/14/2021   Mood swings    Obesity (BMI 30-39.9) 02/06/2021   Overweight    Persistent indigestion    Primary hypertension 02/06/2021   Seasonal allergies    TMJ arthralgia     Past Surgical History:  Procedure Laterality Date   ABDOMINAL HYSTERECTOMY     CESAREAN SECTION  1997   CHOLECYSTECTOMY  2004   CYSTECTOMY  1999   TUBAL LIGATION  1998    Family History  Problem  Relation Age of Onset   Heart failure Mother    Asthma Mother    Seizures Mother    Thyroid  disease Mother    Hypertension Father    Arthritis Father    Anemia Father    Asthma Father    Stroke Father    Heart disease Father    Hypercholesterolemia Father    Heart disease Paternal Grandmother    Hypercholesterolemia Paternal Grandmother    Colon cancer Paternal Grandmother    Lung cancer Paternal Grandfather    Breast cancer Paternal Aunt     Social History   Socioeconomic History   Marital status: Single    Spouse name: Not on file   Number of children: 2  Years of education: 107   Highest education level: Not on file  Occupational History   Occupation: JANITORIAL  Tobacco Use   Smoking status: Former    Current packs/day: 0.00    Average packs/day: 0.5 packs/day for 2.0 years (1.0 ttl pk-yrs)    Types: Cigarettes    Start date: 06/02/2015    Quit date: 06/01/2017    Years since quitting: 6.5   Smokeless tobacco: Never  Vaping Use   Vaping status: Never Used  Substance and Sexual Activity   Alcohol use: Not Currently   Drug use: Not Currently   Sexual activity: Not Currently  Other Topics Concern   Not on file  Social History Narrative   ** Merged History Encounter **       Social Drivers of Health   Financial Resource Strain: Not on file  Food Insecurity: Food Insecurity Present (06/30/2022)   Hunger Vital Sign    Worried About Running Out of Food in the Last Year: Sometimes true    Ran Out of Food in the Last Year: Never true  Transportation Needs: No Transportation Needs (06/30/2022)   PRAPARE - Administrator, Civil Service (Medical): No    Lack of Transportation (Non-Medical): No  Physical Activity: Not on file  Stress: Not on file  Social Connections: Not on file  Intimate Partner Violence: Not At Risk (06/30/2022)   Humiliation, Afraid, Rape, and Kick questionnaire    Fear of Current or Ex-Partner: No    Emotionally Abused: No     Physically Abused: No    Sexually Abused: No    Review of Systems  Constitutional: Negative.   Cardiovascular: Negative.   Gastrointestinal:  Positive for abdominal pain and diarrhea.  Neurological: Negative.   Psychiatric/Behavioral:  Positive for depression.         Objective    BP 124/80   Pulse 92   Temp 98.1 F (36.7 C)   Resp 18   Ht 5' 1 (1.549 m)   Wt 187 lb (84.8 kg)   SpO2 98%   BMI 35.33 kg/m   Physical Exam Constitutional:      Appearance: Normal appearance.  Cardiovascular:     Rate and Rhythm: Normal rate and regular rhythm.     Heart sounds: Normal heart sounds.  Pulmonary:     Effort: Pulmonary effort is normal.     Breath sounds: Normal breath sounds.  Abdominal:     General: Bowel sounds are normal.     Palpations: Abdomen is soft.  Neurological:     General: No focal deficit present.     Mental Status: She is alert and oriented to person, place, and time.         Assessment & Plan:   Problem List Items Addressed This Visit       Cardiovascular and Mediastinum   Primary hypertension - Primary   Controlled and she take clonidine  0.1 mg twice a day      Relevant Medications   cloNIDine  (CATAPRES ) 0.1 MG tablet     Musculoskeletal and Integument   Arthritis   I have discussed with her that we will do workup for inflammatory arthritis on next visit.        Other   Menopausal and postmenopausal disorder   She will continue to take hormone replacement.      Mixed dyslipidemia   She will continue with rosuvastatin  10 mg daily.  I will do lipid panel and CMP on next visit.  Diverticulitis   She will see Dr. Larene on next visit.      Recurrent major depressive disorder, in partial remission (HCC)   She will continue with bupropion and I will add Vraylar  1.5 mg daily.  I have given her a sample and if she has any side effects then she will call.       Return in about 2 weeks (around 01/10/2024).   Roetta Dare,  MD

## 2023-12-27 NOTE — Assessment & Plan Note (Signed)
 Controlled and she take clonidine  0.1 mg twice a day

## 2023-12-28 NOTE — Assessment & Plan Note (Signed)
 She will continue with rosuvastatin  10 mg daily.  I will do lipid panel and CMP on next visit.

## 2023-12-28 NOTE — Assessment & Plan Note (Signed)
 She will continue to take hormone replacement.

## 2023-12-28 NOTE — Assessment & Plan Note (Signed)
 She will continue with bupropion and I will add Vraylar  1.5 mg daily.  I have given her a sample and if she has any side effects then she will call.

## 2023-12-28 NOTE — Assessment & Plan Note (Signed)
 She will see Dr. Larene on next visit.

## 2023-12-28 NOTE — Assessment & Plan Note (Signed)
 I have discussed with her that we will do workup for inflammatory arthritis on next visit.

## 2024-01-10 ENCOUNTER — Telehealth: Admitting: Internal Medicine

## 2024-01-10 VITALS — Temp 97.2°F | Resp 18

## 2024-01-10 DIAGNOSIS — R052 Subacute cough: Secondary | ICD-10-CM

## 2024-01-10 DIAGNOSIS — R0981 Nasal congestion: Secondary | ICD-10-CM | POA: Diagnosis not present

## 2024-01-10 DIAGNOSIS — U071 COVID-19: Secondary | ICD-10-CM | POA: Diagnosis not present

## 2024-01-10 LAB — POC COVID19 BINAXNOW: SARS Coronavirus 2 Ag: POSITIVE — AB

## 2024-01-10 MED ORDER — PAXLOVID (300/100) 20 X 150 MG & 10 X 100MG PO TBPK: ORAL_TABLET | ORAL | 0 refills | Status: DC

## 2024-01-10 MED ORDER — PAXLOVID (300/100) 20 X 150 MG & 10 X 100MG PO TBPK
3.0000 | ORAL_TABLET | Freq: Two times a day (BID) | ORAL | 0 refills | Status: AC
Start: 2024-01-10 — End: 2024-01-15

## 2024-01-10 NOTE — Assessment & Plan Note (Signed)
 Her COVID test is positive.  I will start her on Paxil of weight 3 tablet twice a day for 5 days.  She will stay at home for 5 days.  I will send note for her work.  If her shortness of breath get worse then she will go to the emergency room.

## 2024-01-10 NOTE — Progress Notes (Signed)
   Acute Office Visit  Subjective:     Patient ID: Elizabeth Montoya, female    DOB: [birth year], 48 y.o.   MRN: [id number]  Chief Complaint  Patient presents with   OFFICE VISIT    Patient here fro upset stomach , diarrhea , body aches SHOB , congestion , symptoms  for 4 days      HPI Patient is in today for stuffy nose and sore throat since yesterday.  She says that her symptoms started on Saturday when she started having upset stomach.  Yesterday she started having sore throat and nasal congestion.  She thought that she has been cleaning her house may be she has some allergies but today she felt more lethargic.  She was told that [facility] of the [facility] been [facility] were tested positive for COVID so she came here and her COVID test is positive.  She says that she is short of breath but her saturation is 97% on room air.  She is afebrile.  Review of Systems  Constitutional:  Positive for malaise/fatigue.  HENT:  Positive for congestion.   Respiratory:  Positive for cough.   Cardiovascular: Negative.   Neurological: Negative.         Objective:    Temp (!) 97.2 F (36.2 C)   Resp 18   SpO2 97%    Physical Exam Constitutional:      Appearance: Normal appearance.  Neurological:     General: No focal deficit present.     Mental Status: She is alert and oriented to person, place, and time.     This was a video visit so no other physical exam was done  Results for orders placed or performed in visit on 01/10/24  POC COVID-19 BinaxNow  Result Value Ref Range   SARS Coronavirus 2 Ag Positive (A) Negative        Assessment & Plan:   Problem List Items Addressed This Visit       Other   Subacute cough   Nasal congestion - Primary   Relevant Orders   POC COVID-19 BinaxNow (Completed)   COVID-19   Relevant Medications   nirmatrelvir/ritonavir (PAXLOVID , 300/100,) 20 x 150 MG & 10 x 100MG  TBPK    Meds ordered this encounter  Medications    DISCONTD: nirmatrelvir/ritonavir (PAXLOVID , 300/100,) 20 x 150 MG & 10 x 100MG  TBPK    Sig: Patient GFR is 60. Take nirmatrelvir (150 mg) two tablets twice daily for 5 days and ritonavir (100 mg) one tablet twice daily for 5 days.    Dispense:  30 tablet    Refill:  0   nirmatrelvir/ritonavir (PAXLOVID , 300/100,) 20 x 150 MG & 10 x 100MG  TBPK    Sig: Take 3 tablets by mouth 2 (two) times daily for 5 days. Patient GFR is 60. Take nirmatrelvir (150 mg) two tablets twice daily for 5 days and ritonavir (100 mg) one tablet twice daily for 5 days.    Dispense:  30 tablet    Refill:  0    No follow-ups on file.  Roetta Dare, MD

## 2024-01-17 ENCOUNTER — Ambulatory Visit: Admitting: Internal Medicine

## 2024-01-17 ENCOUNTER — Encounter: Payer: Self-pay | Admitting: Internal Medicine

## 2024-01-17 VITALS — BP 126/80 | HR 91 | Temp 97.9°F | Resp 18 | Ht 61.0 in | Wt 186.4 lb

## 2024-01-17 DIAGNOSIS — R5383 Other fatigue: Secondary | ICD-10-CM

## 2024-01-17 DIAGNOSIS — M79644 Pain in right finger(s): Secondary | ICD-10-CM

## 2024-01-17 DIAGNOSIS — M1811 Unilateral primary osteoarthritis of first carpometacarpal joint, right hand: Secondary | ICD-10-CM | POA: Diagnosis not present

## 2024-01-17 DIAGNOSIS — M79645 Pain in left finger(s): Secondary | ICD-10-CM | POA: Diagnosis not present

## 2024-01-17 DIAGNOSIS — E039 Hypothyroidism, unspecified: Secondary | ICD-10-CM

## 2024-01-17 DIAGNOSIS — M130 Polyarthritis, unspecified: Secondary | ICD-10-CM

## 2024-01-17 DIAGNOSIS — M19041 Primary osteoarthritis, right hand: Secondary | ICD-10-CM | POA: Diagnosis not present

## 2024-01-17 MED ORDER — ONDANSETRON HCL 4 MG PO TABS: 4.0000 mg | ORAL_TABLET | Freq: Two times a day (BID) | ORAL | 1 refills | Status: DC

## 2024-01-17 NOTE — Progress Notes (Signed)
   Office Visit  Subjective   Patient ID: Elizabeth Montoya   DOB: 14-Feb-1976   Age: 48 y.o.   MRN: [id number]   Chief Complaint Chief Complaint  Patient presents with   Weight Loss     History of Present Illness 48 year old female is here complaining of fatigue, tiredness and pain in her multiple joints.  She says that pain in base of right thumb bother her a lot.  She had a chest she has stiffness in her joints  In the morning.  She noticed swelling in her hands as well.  She has a pain in her wrist as well.    She also says that she feel tired all the time and feel that she has no energy.  She has a history of depression for that she take bupropion 300 mg daily.    She also has hypothyroidism and she takes levothyroxine 75 mcg daily and liothyronine 5 mcg daily.  Past Medical History Past Medical History:  Diagnosis Date   Anxiety    Constipation, chronic    Fatigue    GERD (gastroesophageal reflux disease)    Hormone replacement therapy    Menopausal and postmenopausal disorder 02/12/2021   Menopause 02/12/2021   Mild obstructive sleep apnea 04/17/2021   Mixed dyslipidemia 02/14/2021   Mood swings    Obesity (BMI 30-39.9) 02/06/2021   Overweight    Persistent indigestion    Primary hypertension 02/06/2021   Seasonal allergies    TMJ arthralgia      Allergies Allergies  Allergen Reactions   Codeine Other (See Comments)     Review of Systems Review of Systems  Constitutional: Negative.   Respiratory: Negative.    Cardiovascular: Negative.   Gastrointestinal: Negative.   Musculoskeletal:  Positive for joint pain.       Objective:    Vitals BP 126/80 (BP Location: Left Arm, Patient Position: Sitting, Cuff Size: Normal)   Pulse 91   Temp 97.9 F (36.6 C)   Resp 18   Ht 5' 1 (1.549 m)   Wt 186 lb 6 oz (84.5 kg)   SpO2 97%   BMI 35.22 kg/m    Physical Examination Physical Exam Constitutional:      Appearance: Normal appearance.  HENT:     Head:  Normocephalic and atraumatic.  Cardiovascular:     Rate and Rhythm: Normal rate and regular rhythm.     Heart sounds: Normal heart sounds.  Pulmonary:     Effort: Pulmonary effort is normal.     Breath sounds: Normal breath sounds.  Abdominal:     General: Bowel sounds are normal.     Palpations: Abdomen is soft.  Musculoskeletal:        General: Tenderness present.  Neurological:     General: No focal deficit present.     Mental Status: She is alert and oriented to person, place, and time.        Assessment & Plan:   Acquired hypothyroidism   I will do TSH level.  And that re-evaluate.  Polyarthritis of multiple sites   I will do evaluation for inflammatory arthritis doing, ANA, rheumatoid factor and anti CCP.  I will also CRP.  Pain of right thumb  I will also do x-ray of right thumb.  Fatigue   I will do CBC and initial labs and then will re-evaluate.    Return in about 1 month (around 02/17/2024) for For PE.   Roetta Dare, MD

## 2024-01-18 ENCOUNTER — Ambulatory Visit

## 2024-01-18 DIAGNOSIS — M130 Polyarthritis, unspecified: Secondary | ICD-10-CM | POA: Diagnosis not present

## 2024-01-18 DIAGNOSIS — M79645 Pain in left finger(s): Secondary | ICD-10-CM | POA: Diagnosis not present

## 2024-01-18 DIAGNOSIS — E039 Hypothyroidism, unspecified: Secondary | ICD-10-CM | POA: Diagnosis not present

## 2024-01-18 DIAGNOSIS — N951 Menopausal and female climacteric states: Secondary | ICD-10-CM | POA: Diagnosis not present

## 2024-01-18 DIAGNOSIS — R5383 Other fatigue: Secondary | ICD-10-CM | POA: Diagnosis not present

## 2024-01-18 DIAGNOSIS — M19041 Primary osteoarthritis, right hand: Secondary | ICD-10-CM | POA: Diagnosis not present

## 2024-01-19 LAB — SEDIMENTATION RATE: Sed Rate: 15 mm/h (ref 0–32)

## 2024-01-20 LAB — CBC WITH DIFFERENTIAL/PLATELET
Basophils Absolute: 0.1 x10E3/uL (ref 0.0–0.2)
Basos: 1 %
EOS (ABSOLUTE): 0.3 x10E3/uL (ref 0.0–0.4)
Eos: 3 %
Hematocrit: 46.7 % — ABNORMAL HIGH (ref 34.0–46.6)
Hemoglobin: 15.3 g/dL (ref 11.1–15.9)
Immature Grans (Abs): 0.1 x10E3/uL (ref 0.0–0.1)
Immature Granulocytes: 1 %
Lymphocytes Absolute: 4.5 x10E3/uL — ABNORMAL HIGH (ref 0.7–3.1)
Lymphs: 39 %
MCH: 29.7 pg (ref 26.6–33.0)
MCHC: 32.8 g/dL (ref 31.5–35.7)
MCV: 91 fL (ref 79–97)
Monocytes Absolute: 0.7 x10E3/uL (ref 0.1–0.9)
Monocytes: 6 %
Neutrophils Absolute: 5.7 x10E3/uL (ref 1.4–7.0)
Neutrophils: 50 %
Platelets: 434 x10E3/uL (ref 150–450)
RBC: 5.16 x10E6/uL (ref 3.77–5.28)
RDW: 13.3 % (ref 11.7–15.4)
WBC: 11.4 x10E3/uL — ABNORMAL HIGH (ref 3.4–10.8)

## 2024-01-20 LAB — TSH: TSH: 0.546 u[IU]/mL (ref 0.450–4.500)

## 2024-01-20 LAB — CMP14 + ANION GAP
ALT: 24 IU/L (ref 0–32)
AST: 23 IU/L (ref 0–40)
Albumin: 4.5 g/dL (ref 3.9–4.9)
Alkaline Phosphatase: 76 IU/L (ref 44–121)
Anion Gap: 15 mmol/L (ref 10.0–18.0)
BUN/Creatinine Ratio: 10 (ref 9–23)
BUN: 8 mg/dL (ref 6–24)
Bilirubin Total: 0.4 mg/dL (ref 0.0–1.2)
CO2: 22 mmol/L (ref 20–29)
Calcium: 9.6 mg/dL (ref 8.7–10.2)
Chloride: 101 mmol/L (ref 96–106)
Creatinine, Ser: 0.8 mg/dL (ref 0.57–1.00)
Globulin, Total: 2.4 g/dL (ref 1.5–4.5)
Glucose: 86 mg/dL (ref 70–99)
Potassium: 4.1 mmol/L (ref 3.5–5.2)
Sodium: 138 mmol/L (ref 134–144)
Total Protein: 6.9 g/dL (ref 6.0–8.5)
eGFR: 90 mL/min/1.73 (ref >59)

## 2024-01-20 LAB — C-REACTIVE PROTEIN: CRP: 4 mg/L (ref 0–10)

## 2024-01-20 LAB — ANTINUCLEAR ANTIBODIES, IFA: ANA Titer 1: NEGATIVE

## 2024-01-20 LAB — CYCLIC CITRUL PEPTIDE ANTIBODY, IGG/IGA: Cyclic Citrullin Peptide Ab: 5 U (ref 0–19)

## 2024-01-20 LAB — RHEUMATOID FACTOR: Rheumatoid fact SerPl-aCnc: <10 [IU]/mL (ref <14.0)

## 2024-01-21 ENCOUNTER — Other Ambulatory Visit: Payer: Self-pay

## 2024-01-25 DIAGNOSIS — R109 Unspecified abdominal pain: Secondary | ICD-10-CM | POA: Diagnosis not present

## 2024-01-25 DIAGNOSIS — K219 Gastro-esophageal reflux disease without esophagitis: Secondary | ICD-10-CM | POA: Diagnosis not present

## 2024-01-25 DIAGNOSIS — R110 Nausea: Secondary | ICD-10-CM | POA: Diagnosis not present

## 2024-01-26 ENCOUNTER — Encounter: Payer: Self-pay | Admitting: Internal Medicine

## 2024-01-27 DIAGNOSIS — N951 Menopausal and female climacteric states: Secondary | ICD-10-CM | POA: Diagnosis not present

## 2024-01-27 DIAGNOSIS — E039 Hypothyroidism, unspecified: Secondary | ICD-10-CM | POA: Diagnosis not present

## 2024-01-27 DIAGNOSIS — F339 Major depressive disorder, recurrent, unspecified: Secondary | ICD-10-CM | POA: Diagnosis not present

## 2024-01-28 NOTE — Assessment & Plan Note (Signed)
 I will do evaluation for inflammatory arthritis doing, ANA, rheumatoid factor and anti CCP.  I will also CRP.

## 2024-01-28 NOTE — Assessment & Plan Note (Signed)
 I will do CBC and initial labs and then will re-evaluate.

## 2024-01-28 NOTE — Assessment & Plan Note (Signed)
 I will also do x-ray of right thumb.

## 2024-01-28 NOTE — Assessment & Plan Note (Signed)
 I will do TSH level.  And that re-evaluate.

## 2024-02-07 ENCOUNTER — Ambulatory Visit: Admitting: Internal Medicine

## 2024-02-07 ENCOUNTER — Encounter: Payer: Self-pay | Admitting: Internal Medicine

## 2024-02-07 VITALS — BP 120/74 | HR 84 | Temp 97.4°F | Resp 18 | Ht 61.0 in | Wt 191.0 lb

## 2024-02-07 DIAGNOSIS — R5383 Other fatigue: Secondary | ICD-10-CM

## 2024-02-07 DIAGNOSIS — F3341 Major depressive disorder, recurrent, in partial remission: Secondary | ICD-10-CM | POA: Diagnosis not present

## 2024-02-07 DIAGNOSIS — R0981 Nasal congestion: Secondary | ICD-10-CM | POA: Diagnosis not present

## 2024-02-07 LAB — POC COVID19 BINAXNOW: SARS Coronavirus 2 Ag: NEGATIVE

## 2024-02-07 MED ORDER — FLUTICASONE PROPIONATE 50 MCG/ACT NA SUSP: 1.0000 | Freq: Every day | NASAL | 2 refills | Status: AC

## 2024-02-07 MED ORDER — MELOXICAM 7.5 MG PO TABS: 7.5000 mg | ORAL_TABLET | Freq: Every day | ORAL | 2 refills | Status: DC | PRN

## 2024-02-07 MED ORDER — TRAMADOL HCL 50 MG PO TABS: 50.0000 mg | ORAL_TABLET | Freq: Two times a day (BID) | ORAL | 0 refills | Status: DC | PRN

## 2024-02-07 NOTE — Progress Notes (Unsigned)
   Office Visit  Subjective   Patient ID: Elizabeth Montoya   DOB: December 16, 1975   Age: 48 y.o.   MRN: [id number]   Chief Complaint Chief Complaint  Patient presents with   office visit    Patient here for change depression      History of Present Illness   Past Medical History Past Medical History:  Diagnosis Date   Anxiety    Constipation, chronic    Fatigue    GERD (gastroesophageal reflux disease)    Hormone replacement therapy    Menopausal and postmenopausal disorder 02/12/2021   Menopause 02/12/2021   Mild obstructive sleep apnea 04/17/2021   Mixed dyslipidemia 02/14/2021   Mood swings    Obesity (BMI 30-39.9) 02/06/2021   Overweight    Persistent indigestion    Primary hypertension 02/06/2021   Seasonal allergies    TMJ arthralgia      Allergies Allergies  Allergen Reactions   Codeine Other (See Comments)     Review of Systems ROS     Objective:    Vitals BP 120/74   Pulse 84   Temp (!) 97.4 F (36.3 C)   Resp 18   Ht 5' 1 (1.549 m)   Wt 191 lb (86.6 kg)   SpO2 97%   BMI 36.09 kg/m    Physical Examination Physical Exam     Assessment & Plan:   No problem-specific Assessment & Plan notes found for this encounter.    No follow-ups on file.   Roetta Dare, MD

## 2024-02-08 NOTE — Assessment & Plan Note (Signed)
 I believe this might be related with her depression.  I will discuss with psych.

## 2024-02-08 NOTE — Assessment & Plan Note (Signed)
 She will use fluticasone  nasal spray.  Her COVID test is negative.

## 2024-02-08 NOTE — Assessment & Plan Note (Signed)
 Her depression is not controlled.

## 2024-02-20 DIAGNOSIS — R051 Acute cough: Secondary | ICD-10-CM | POA: Diagnosis not present

## 2024-02-20 DIAGNOSIS — R0981 Nasal congestion: Secondary | ICD-10-CM | POA: Diagnosis not present

## 2024-02-24 ENCOUNTER — Encounter: Payer: Self-pay | Admitting: Internal Medicine

## 2024-02-24 ENCOUNTER — Ambulatory Visit: Admitting: Internal Medicine

## 2024-02-24 VITALS — BP 130/80 | HR 78 | Temp 97.7°F | Resp 18 | Ht 61.0 in | Wt 188.0 lb

## 2024-02-24 DIAGNOSIS — Z131 Encounter for screening for diabetes mellitus: Secondary | ICD-10-CM | POA: Diagnosis not present

## 2024-02-24 DIAGNOSIS — Z0000 Encounter for general adult medical examination without abnormal findings: Secondary | ICD-10-CM | POA: Insufficient documentation

## 2024-02-24 DIAGNOSIS — K5909 Other constipation: Secondary | ICD-10-CM

## 2024-02-24 DIAGNOSIS — E039 Hypothyroidism, unspecified: Secondary | ICD-10-CM

## 2024-02-24 DIAGNOSIS — F3341 Major depressive disorder, recurrent, in partial remission: Secondary | ICD-10-CM

## 2024-02-24 DIAGNOSIS — R0981 Nasal congestion: Secondary | ICD-10-CM

## 2024-02-24 DIAGNOSIS — J329 Chronic sinusitis, unspecified: Secondary | ICD-10-CM | POA: Insufficient documentation

## 2024-02-24 DIAGNOSIS — M130 Polyarthritis, unspecified: Secondary | ICD-10-CM

## 2024-02-24 DIAGNOSIS — Z6835 Body mass index (BMI) 35.0-35.9, adult: Secondary | ICD-10-CM

## 2024-02-24 DIAGNOSIS — I10 Essential (primary) hypertension: Secondary | ICD-10-CM | POA: Diagnosis not present

## 2024-02-24 DIAGNOSIS — N959 Unspecified menopausal and perimenopausal disorder: Secondary | ICD-10-CM

## 2024-02-24 DIAGNOSIS — E782 Mixed hyperlipidemia: Secondary | ICD-10-CM

## 2024-02-24 LAB — POC COVID19 BINAXNOW: SARS Coronavirus 2 Ag: NEGATIVE

## 2024-02-24 MED ORDER — SALINE SPRAY 0.65 % NA SOLN: 1.0000 | NASAL | 0 refills | Status: AC | PRN

## 2024-02-24 NOTE — Assessment & Plan Note (Signed)
 She does not take any medication.  I will do lipid panel today.

## 2024-02-24 NOTE — Assessment & Plan Note (Signed)
I will do hemoglobin A1c.

## 2024-02-24 NOTE — Assessment & Plan Note (Signed)
 Her TSH level is therapeutic.  She take levothyroxine 75 mcg daily and liothyronine 5 mcg daily.

## 2024-02-24 NOTE — Assessment & Plan Note (Signed)
 She follows with gynecologist.

## 2024-02-24 NOTE — Assessment & Plan Note (Addendum)
 She take meloxicam  as needed.  She also takes tramadol  as needed.

## 2024-02-24 NOTE — Assessment & Plan Note (Signed)
 Her blood pressure is better and she has stopped taking blood pressure medication.  Will continue to monitor her blood pressure.

## 2024-02-24 NOTE — Assessment & Plan Note (Signed)
 Her depression is better and she takes bupropion 300 mg daily.

## 2024-02-24 NOTE — Progress Notes (Signed)
 Office Visit  Subjective   Patient ID: Elizabeth Montoya   DOB: [birth year]   Age: 48 y.o.   MRN: [id number]   Chief Complaint Chief Complaint  Patient presents with   Annual Exam     History of Present Illness 48 years old female is here for annual physical examination. She is married, she has two children. She work at [employer] as [employer]. She does not smoke, she occasionally drink. She does not take recreational drugs.   She does not get flu shot every year. She did not have COVID vaccine. She has tetanus vaccine few years ago.  She has CAD in her family. She has breast cancer and lung cancer in her family.  She examine her breast and she has mammogram. She is due for repeat mammogram. She has  hysterectomy and has PAP smear few years ago. She still has ovaries. She will have colonoscopy next month by Dr. Larene.  She has hypertension. She stopped taking blood pressure medication.   She has bad acid reflux and she takes omeprazole twice a day and she will have EGD next month as well.   She has post menopause but her symptoms are controlled.   She has hypothyroidism and she take levothyroxine 75 mcg daily. Her TSH was therapeutic on January 18, 2024.  She has arthritis and she take meloxicam .  She also has bad sinus she has throbbing right eye for 3 days. No blurry vision. She went to urgent care and they have given her steroid and today is the last dose of steroid and antibiotic.   She has postmenopausal symptoms and she follows with gynecologist.  She is getting progesterone  that dose was increased to 400 mg daily, is steady all 1.5 mg daily and she also take testosterone  cream.   Past Medical History Past Medical History:  Diagnosis Date   Anxiety    Constipation, chronic    Fatigue    GERD (gastroesophageal reflux disease)    Hormone replacement therapy    Menopausal and postmenopausal disorder 02/12/2021   Menopause 02/12/2021   Mild obstructive sleep apnea  04/17/2021   Mixed dyslipidemia 02/14/2021   Mood swings    Obesity (BMI 30-39.9) 02/06/2021   Overweight    Persistent indigestion    Primary hypertension 02/06/2021   Seasonal allergies    TMJ arthralgia      Allergies Allergies  Allergen Reactions   Codeine Other (See Comments)     Review of Systems Review of Systems  Constitutional: Negative.   HENT:  Positive for congestion and sinus pain.   Eyes:        Pain behind right eye  Respiratory: Negative.    Cardiovascular: Negative.   Gastrointestinal: Negative.   Neurological: Negative.        Objective:    Vitals BP 130/80   Pulse 78   Temp 97.7 F (36.5 C)   Resp 18   Ht 5' 1 (1.549 m)   Wt 188 lb (85.3 kg)   SpO2 98%   BMI 35.52 kg/m    Physical Examination Physical Exam Constitutional:      Appearance: Normal appearance. She is obese.  HENT:     Head: Normocephalic and atraumatic.  Eyes:     Extraocular Movements: Extraocular movements intact.     Pupils: Pupils are equal, round, and reactive to light.  Cardiovascular:     Rate and Rhythm: Normal rate and regular rhythm.  Pulmonary:     Effort: Pulmonary  effort is normal.     Breath sounds: Normal breath sounds.  Abdominal:     General: Bowel sounds are normal.     Palpations: Abdomen is soft.  Neurological:     General: No focal deficit present.     Mental Status: She is alert and oriented to person, place, and time.        Assessment & Plan:   Primary hypertension   Her blood pressure is better and she has stopped taking blood pressure medication.  Will continue to monitor her blood pressure.  Sinusitis   She use allergy tablet and fluticasone  nasal spray.  I will also add saline mist  To use as needed.  Constipation, chronic   She has added increase fiber in her diet and her constipation is better.  Acquired hypothyroidism   Her TSH level is therapeutic.  She take levothyroxine 75 mcg daily and liothyronine 5 mcg  daily.  Polyarthritis of multiple sites   She take meloxicam  as needed.  Screening for diabetes mellitus (DM)   I will do hemoglobin A1c.  Recurrent major depressive disorder, in partial remission   Her depression is better and she takes bupropion 300 mg daily.  Mixed dyslipidemia   She does not take any medication.  I will do lipid panel today.  Menopausal and postmenopausal disorder   She follows with gynecologist.  Annual physical exam    She does not get flu shot.  Her COVID shot was few years ago.  She has Pap smear  Few years ago even though she has hysterectomy and she follows with a gynecologist.  I will schedule her mammogram.  She will also has colonoscopy and EGD next month.    Return in about 3 months (around 05/25/2024).   Roetta Dare, MD

## 2024-02-24 NOTE — Assessment & Plan Note (Signed)
 She has added increase fiber in her diet and her constipation is better.

## 2024-02-24 NOTE — Assessment & Plan Note (Addendum)
 She use allergy tablet and fluticasone  nasal spray.  I will also add saline mist  To use as needed.  Her COVID test is negative.  Her right eye pain resolved with high oxygen.

## 2024-02-24 NOTE — Assessment & Plan Note (Signed)
 She does not get flu shot.  Her COVID shot was few years ago.  She has Pap smear  Few years ago even though she has hysterectomy and she follows with a gynecologist.  I will schedule her mammogram.  She will also has colonoscopy and EGD next month.

## 2024-02-25 ENCOUNTER — Other Ambulatory Visit: Payer: Self-pay | Admitting: Internal Medicine

## 2024-02-25 DIAGNOSIS — J309 Allergic rhinitis, unspecified: Secondary | ICD-10-CM

## 2024-02-25 LAB — CBC WITH DIFFERENTIAL/PLATELET
Basophils Absolute: 0.1 x10E3/uL (ref 0.0–0.2)
Basos: 1 %
EOS (ABSOLUTE): 0.2 x10E3/uL (ref 0.0–0.4)
Eos: 1 %
Hematocrit: 44.8 % (ref 34.0–46.6)
Hemoglobin: 14.7 g/dL (ref 11.1–15.9)
Immature Grans (Abs): 0.2 x10E3/uL — ABNORMAL HIGH (ref 0.0–0.1)
Immature Granulocytes: 1 %
Lymphocytes Absolute: 6.8 x10E3/uL — ABNORMAL HIGH (ref 0.7–3.1)
Lymphs: 37 %
MCH: 29.8 pg (ref 26.6–33.0)
MCHC: 32.8 g/dL (ref 31.5–35.7)
MCV: 91 fL (ref 79–97)
Monocytes Absolute: 1.2 x10E3/uL — ABNORMAL HIGH (ref 0.1–0.9)
Monocytes: 7 %
Neutrophils Absolute: 9.8 x10E3/uL — ABNORMAL HIGH (ref 1.4–7.0)
Neutrophils: 53 %
Platelets: 394 x10E3/uL (ref 150–450)
RBC: 4.93 x10E6/uL (ref 3.77–5.28)
RDW: 13.1 % (ref 11.7–15.4)
WBC: 18.2 x10E3/uL — ABNORMAL HIGH (ref 3.4–10.8)

## 2024-02-25 LAB — HEMOGLOBIN A1C
Est. average glucose Bld gHb Est-mCnc: 114 mg/dL
Hgb A1c MFr Bld: 5.6 % (ref 4.8–5.6)

## 2024-02-25 LAB — LIPID PANEL
Chol/HDL Ratio: 5.4 ratio — ABNORMAL HIGH (ref 0.0–4.4)
Cholesterol, Total: 257 mg/dL — ABNORMAL HIGH (ref 100–199)
HDL: 48 mg/dL (ref >39)
LDL Chol Calc (NIH): 164 mg/dL — ABNORMAL HIGH (ref 0–99)
Triglycerides: 243 mg/dL — ABNORMAL HIGH (ref 0–149)
VLDL Cholesterol Cal: 45 mg/dL — ABNORMAL HIGH (ref 5–40)

## 2024-03-01 DIAGNOSIS — J069 Acute upper respiratory infection, unspecified: Secondary | ICD-10-CM | POA: Diagnosis not present

## 2024-03-01 DIAGNOSIS — R0981 Nasal congestion: Secondary | ICD-10-CM | POA: Diagnosis not present

## 2024-03-08 ENCOUNTER — Ambulatory Visit: Payer: Self-pay

## 2024-03-08 NOTE — Progress Notes (Signed)
 Patient called.  Patient aware. her cholestrol is high and I will discuss with her on next visit

## 2024-03-15 DIAGNOSIS — J321 Chronic frontal sinusitis: Secondary | ICD-10-CM | POA: Diagnosis not present

## 2024-03-15 DIAGNOSIS — R0981 Nasal congestion: Secondary | ICD-10-CM | POA: Diagnosis not present

## 2024-04-14 DIAGNOSIS — K219 Gastro-esophageal reflux disease without esophagitis: Secondary | ICD-10-CM | POA: Diagnosis not present

## 2024-04-14 DIAGNOSIS — Z1211 Encounter for screening for malignant neoplasm of colon: Secondary | ICD-10-CM | POA: Diagnosis not present

## 2024-04-14 DIAGNOSIS — K5730 Diverticulosis of large intestine without perforation or abscess without bleeding: Secondary | ICD-10-CM | POA: Diagnosis not present

## 2024-04-14 DIAGNOSIS — K644 Residual hemorrhoidal skin tags: Secondary | ICD-10-CM | POA: Diagnosis not present

## 2024-04-14 DIAGNOSIS — F419 Anxiety disorder, unspecified: Secondary | ICD-10-CM | POA: Diagnosis not present

## 2024-04-14 DIAGNOSIS — Z885 Allergy status to narcotic agent status: Secondary | ICD-10-CM | POA: Diagnosis not present

## 2024-04-14 DIAGNOSIS — K449 Diaphragmatic hernia without obstruction or gangrene: Secondary | ICD-10-CM | POA: Diagnosis not present

## 2024-05-01 ENCOUNTER — Ambulatory Visit: Admitting: Internal Medicine

## 2024-05-01 ENCOUNTER — Encounter: Payer: Self-pay | Admitting: Internal Medicine

## 2024-05-01 VITALS — BP 130/90 | HR 100 | Temp 98.3°F | Resp 18 | Ht 61.0 in | Wt 184.1 lb

## 2024-05-01 DIAGNOSIS — F419 Anxiety disorder, unspecified: Secondary | ICD-10-CM

## 2024-05-01 DIAGNOSIS — R21 Rash and other nonspecific skin eruption: Secondary | ICD-10-CM | POA: Insufficient documentation

## 2024-05-01 MED ORDER — BUSPIRONE HCL 7.5 MG PO TABS: 7.5000 mg | ORAL_TABLET | Freq: Two times a day (BID) | ORAL | 1 refills | Status: DC

## 2024-05-01 MED ORDER — TRAMADOL HCL 50 MG PO TABS: 50.0000 mg | ORAL_TABLET | Freq: Two times a day (BID) | ORAL | 0 refills | Status: DC | PRN

## 2024-05-01 NOTE — Progress Notes (Signed)
   Office Visit  Subjective   Patient ID: Elizabeth Montoya   DOB: 1975/12/15   Age: 48 y.o.   MRN: [id number]   Chief Complaint Chief Complaint  Patient presents with   Office visit    Break outs on different parts of body, and stomach.     History of Present Illness   48 years old female who is here complaining of rash  broke out in the neck and upper chest.  She is not taking any new medications.  Has does not itch.  It is fading away from her arms.  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  Her scar also broke and that put her in very stressful condition and she believes that her rash grew code because of stress.  She has a history of depression and she take bupropion 150 mg 2 tablets daily.  She do not have any suicidal ideation.  Past Medical History Past Medical History:  Diagnosis Date   Anxiety    Constipation, chronic    Fatigue    GERD (gastroesophageal reflux disease)    Hormone replacement therapy    Menopausal and postmenopausal disorder 02/12/2021   Menopause 02/12/2021   Mild obstructive sleep apnea 04/17/2021   Mixed dyslipidemia 02/14/2021   Mood swings    Obesity (BMI 30-39.9) 02/06/2021   Overweight    Persistent indigestion    Primary hypertension 02/06/2021   Seasonal allergies    TMJ arthralgia      Allergies Allergies  Allergen Reactions   Codeine Other (See Comments)     Review of Systems Review of Systems  Constitutional: Negative.   Cardiovascular: Negative.   Skin:  Positive for rash.  Psychiatric/Behavioral:  The patient is nervous/anxious.        Objective:    Vitals BP (!) 130/90 (BP Location: Left Arm, Patient Position: Sitting, Cuff Size: Normal)   Pulse 100   Temp 98.3 F (36.8 C)   Resp 18   Ht 5' 1 (1.549 m)   Wt 184 lb 2 oz (83.5 kg)   SpO2 98%   BMI 34.79 kg/m    Physical Examination Physical Exam Constitutional:      Appearance: Normal  appearance.  Skin:    Findings: Erythema present.     Comments:    Erythematous rash In her upper chest and neck area.  Neurological:     Mental Status: She is alert.        Assessment & Plan:   Rash  As rash is fading away so will monitor.  Anxiety   I will give her buspirone 7.5 mg twice a day for anxiety.  She will continue with bupropion 300 mg daily.    No follow-ups on file.   Roetta Dare, MD

## 2024-05-01 NOTE — Assessment & Plan Note (Signed)
 As rash is fading away so will monitor.

## 2024-05-01 NOTE — Assessment & Plan Note (Signed)
 I will give her buspirone 7.5 mg twice a day for anxiety.  She will continue with bupropion 300 mg daily.

## 2024-05-03 DIAGNOSIS — E039 Hypothyroidism, unspecified: Secondary | ICD-10-CM | POA: Diagnosis not present

## 2024-05-03 DIAGNOSIS — N951 Menopausal and female climacteric states: Secondary | ICD-10-CM | POA: Diagnosis not present

## 2024-05-09 ENCOUNTER — Other Ambulatory Visit: Payer: Self-pay | Admitting: Internal Medicine

## 2024-05-09 ENCOUNTER — Ambulatory Visit: Admitting: Internal Medicine

## 2024-05-09 ENCOUNTER — Encounter: Payer: Self-pay | Admitting: Internal Medicine

## 2024-05-09 VITALS — BP 142/98 | HR 96 | Temp 98.3°F | Resp 16 | Ht 61.0 in | Wt 185.4 lb

## 2024-05-09 DIAGNOSIS — I10 Essential (primary) hypertension: Secondary | ICD-10-CM | POA: Diagnosis not present

## 2024-05-09 DIAGNOSIS — N951 Menopausal and female climacteric states: Secondary | ICD-10-CM | POA: Diagnosis not present

## 2024-05-09 DIAGNOSIS — F3341 Major depressive disorder, recurrent, in partial remission: Secondary | ICD-10-CM | POA: Diagnosis not present

## 2024-05-09 MED ORDER — BUSPIRONE HCL 10 MG PO TABS: 10.0000 mg | ORAL_TABLET | Freq: Two times a day (BID) | ORAL | 1 refills | Status: DC

## 2024-05-09 MED ORDER — REXULTI 0.5 MG PO TABS: 0.5000 mg | ORAL_TABLET | Freq: Every day | ORAL | Status: DC

## 2024-05-09 MED ORDER — AMLODIPINE BESYLATE 5 MG PO TABS: 5.0000 mg | ORAL_TABLET | Freq: Every day | ORAL | 11 refills | Status: AC

## 2024-05-09 NOTE — Assessment & Plan Note (Signed)
 I will start her on Rexulti  0.5 mg daily and continue with bupropion 300 mg daily.  If she has any suicidal ideation or side effects then she will call.

## 2024-05-09 NOTE — Assessment & Plan Note (Signed)
 I will start her on amlodipine  5 mg daily.

## 2024-05-09 NOTE — Progress Notes (Addendum)
   Office Visit  Subjective   Patient ID: Elizabeth Montoya   DOB: Sep 20, 1975   Age: 48 y.o.   MRN: [id number]   Chief Complaint Chief Complaint  Patient presents with   Anxiety    Pt in today for anxiety. The patient reports she is currently taking Buspar  and is needing something else, due to not being strong enough.      History of Present Illness   48 years old female who is here complaining of worsening of depression and anxiety.  She says that she never was so depressed this time of year that she is now.  She does not wanted to go out or meet anybody she does not want to go to work.  I have started her on buspirone  7.5 twice a day for anxiety but that is not helping.  She denies any suicidal thought.    Her blood pressure is elevated it is 142/98.  She has history of hypertension and she was taking  propranolol  and amlodipine  but she has stopped that.  She was seen by endocrinologist today and her blood pressure was elevated there as well.  She was advised to come and see us .  Past Medical History Past Medical History:  Diagnosis Date   Anxiety    Constipation, chronic    Fatigue    GERD (gastroesophageal reflux disease)    Hormone replacement therapy    Menopausal and postmenopausal disorder 02/12/2021   Menopause 02/12/2021   Mild obstructive sleep apnea 04/17/2021   Mixed dyslipidemia 02/14/2021   Mood swings    Obesity (BMI 30-39.9) 02/06/2021   Overweight    Persistent indigestion    Primary hypertension 02/06/2021   Seasonal allergies    TMJ arthralgia      Allergies Allergies  Allergen Reactions   Codeine Other (See Comments)     Review of Systems Review of Systems  Constitutional: Negative.   Respiratory: Negative.    Cardiovascular: Negative.   Psychiatric/Behavioral:  Positive for depression. The patient is nervous/anxious.        Objective:    Vitals BP (!) 142/98   Pulse 96   Temp 98.3 F (36.8 C) (Temporal)   Resp 16   Ht 5' 1 (1.549 m)    Wt 185 lb 6.4 oz (84.1 kg)   SpO2 96%   BMI 35.03 kg/m    Physical Examination Physical Exam Constitutional:      Appearance: Normal appearance.  Cardiovascular:     Rate and Rhythm: Normal rate and regular rhythm.     Heart sounds: Normal heart sounds.  Pulmonary:     Effort: Pulmonary effort is normal.     Breath sounds: Normal breath sounds.  Neurological:     Mental Status: She is alert.        Assessment & Plan:   Primary hypertension   I will start her on amlodipine  5 mg daily.  Recurrent major depressive disorder, in partial remission   I will start her on Rexulti  0.5 mg daily and continue with bupropion 300 mg daily.  If she has any suicidal ideation or side effects then she will call.    No follow-ups on file.   Roetta Dare, MD

## 2024-05-11 ENCOUNTER — Other Ambulatory Visit: Payer: Self-pay | Admitting: Internal Medicine

## 2024-05-18 ENCOUNTER — Ambulatory Visit: Admitting: Internal Medicine

## 2024-05-18 VITALS — BP 124/80 | HR 69 | Temp 98.1°F | Resp 18 | Ht 61.0 in | Wt 187.5 lb

## 2024-05-18 DIAGNOSIS — E039 Hypothyroidism, unspecified: Secondary | ICD-10-CM | POA: Diagnosis not present

## 2024-05-18 DIAGNOSIS — F3341 Major depressive disorder, recurrent, in partial remission: Secondary | ICD-10-CM | POA: Diagnosis not present

## 2024-05-18 DIAGNOSIS — I10 Essential (primary) hypertension: Secondary | ICD-10-CM

## 2024-05-18 MED ORDER — BREXPIPRAZOLE 1 MG PO TABS: 1.0000 mg | ORAL_TABLET | Freq: Every day | ORAL | 3 refills | Status: AC

## 2024-05-18 NOTE — Progress Notes (Signed)
 "  Office Visit  Subjective   Patient ID: Elizabeth Montoya   DOB: [birth year]   Age: 48 y.o.   MRN: [id number]   Chief Complaint Chief Complaint  Patient presents with   Follow-up    3 month     History of Present Illness   48 years old female who is here for follow-up.   She says that her depression is better but is not completely resolved.  She takes bupropion 300 mg daily and I have started her on Rexulti  0.5 mg daily.  He denies any side effects.  She wanted to increase the dose to 1 mg daily.  She  has hypertension.  I have reviewed her medications with her.  She was taking propranolol  and clonidine  0.1 mg twice a day.  She said that she has stopped taking propranolol .  She needed refill of clonidine .  She also was taking amlodipine  but she has stopped taking that.   She has a history of asthma but she only takes singular 10 mg daily.   She has hypothyroidism and she takes levothyroxine 75 mcg daily.  She says that she is still tired and gaining weight.  She does not remember when was her last TSH done.   She also has a postmenopausal vasomotor symptoms and getting hot flashes.  She has been taking hormone replacement.  This has helped her a lot per patient.   She also has a history of hyperlipidemia and she takes rosuvastatin  10 mg daily.  Past Medical History Past Medical History:  Diagnosis Date   Anxiety    Constipation, chronic    Fatigue    GERD (gastroesophageal reflux disease)    Hormone replacement therapy    Menopausal and postmenopausal disorder 02/12/2021   Menopause 02/12/2021   Mild obstructive sleep apnea 04/17/2021   Mixed dyslipidemia 02/14/2021   Mood swings    Obesity (BMI 30-39.9) 02/06/2021   Overweight    Persistent indigestion    Primary hypertension 02/06/2021   Seasonal allergies    TMJ arthralgia      Allergies Allergies[1]   Review of Systems Review of Systems  Constitutional: Negative.   HENT: Negative.    Respiratory: Negative.     Cardiovascular: Negative.   Gastrointestinal: Negative.   Neurological: Negative.   Psychiatric/Behavioral:  The patient is nervous/anxious.        Objective:    Vitals BP 124/80   Pulse 69   Temp 98.1 F (36.7 C)   Resp 18   Ht 5' 1 (1.549 m)   Wt 187 lb 8 oz (85 kg)   SpO2 97%   BMI 35.43 kg/m    Physical Examination Physical Exam Constitutional:      Appearance: Normal appearance.  HENT:     Head: Normocephalic and atraumatic.  Cardiovascular:     Rate and Rhythm: Normal rate and regular rhythm.     Heart sounds: Normal heart sounds.  Pulmonary:     Effort: Pulmonary effort is normal.     Breath sounds: Normal breath sounds.  Abdominal:     General: Bowel sounds are normal.     Palpations: Abdomen is soft.  Neurological:     General: No focal deficit present.     Mental Status: She is alert and oriented to person, place, and time.        Assessment & Plan:   Primary hypertension   Her blood pressure is well controlled.  Recurrent major depressive disorder, in partial remission  I will send new prescription of Rexulti  1 mg daily.  She will call if he has any side effects.  Acquired hypothyroidism   Her TSH level was therapeutic in August 2025.  I will continue with current dose of 75 mcg daily.    Return in about 3 months (around 08/16/2024).   Roetta Dare, MD      [1]  Allergies Allergen Reactions   Codeine Other (See Comments)   "

## 2024-05-24 ENCOUNTER — Other Ambulatory Visit: Payer: Self-pay | Admitting: Internal Medicine

## 2024-05-24 DIAGNOSIS — J309 Allergic rhinitis, unspecified: Secondary | ICD-10-CM

## 2024-05-31 ENCOUNTER — Other Ambulatory Visit: Payer: Self-pay | Admitting: Internal Medicine

## 2024-06-03 NOTE — Assessment & Plan Note (Signed)
"    I will send new prescription of Rexulti  1 mg daily.  She will call if he has any side effects. "

## 2024-06-03 NOTE — Assessment & Plan Note (Signed)
"    Her TSH level was therapeutic in August 2025.  I will continue with current dose of 75 mcg daily. "

## 2024-06-03 NOTE — Assessment & Plan Note (Signed)
 Her blood pressure is well controlled.

## 2024-06-07 ENCOUNTER — Ambulatory Visit

## 2024-06-07 VITALS — BP 124/80 | HR 80 | Temp 98.1°F | Resp 18 | Ht 61.0 in | Wt 185.2 lb

## 2024-06-07 DIAGNOSIS — J0191 Acute recurrent sinusitis, unspecified: Secondary | ICD-10-CM

## 2024-06-07 DIAGNOSIS — H6690 Otitis media, unspecified, unspecified ear: Secondary | ICD-10-CM | POA: Insufficient documentation

## 2024-06-07 DIAGNOSIS — R052 Subacute cough: Secondary | ICD-10-CM

## 2024-06-07 LAB — POC INFLUENZA A&B (BINAX/QUICKVUE)
Influenza A, POC: NEGATIVE
Influenza B, POC: NEGATIVE

## 2024-06-07 LAB — POC COVID19 BINAXNOW: SARS Coronavirus 2 Ag: NEGATIVE

## 2024-06-07 MED ORDER — AMOXICILLIN 875 MG PO TABS: 875.0000 mg | ORAL_TABLET | Freq: Two times a day (BID) | ORAL | 0 refills | Status: DC

## 2024-06-07 NOTE — Addendum Note (Signed)
 Addended by: LENETTA LACKS on: 06/07/2024 05:05 PM   Modules accepted: Orders

## 2024-06-07 NOTE — Assessment & Plan Note (Signed)
 Start Amoxicillin  twice a day for five days. Increase fluids and rest until feeling better. Wipe down everything with Lysol wipes. Try to wipe face after being outdoor to remove allergens near face

## 2024-06-07 NOTE — Progress Notes (Signed)
 "  Acute Office Visit  Subjective:     Patient ID: Elizabeth Montoya, female    DOB: [birth year], 49 y.o.   MRN: [id number]  Chief Complaint  Patient presents with   Cough    Congestion, and weak for 2 days.    Patient Is 49 year old female who presents in office today with complaints of muscle aches, vomiting, low grade temperature, coughing, sore throat, headache and congestion. She presently works at [employer]. There has been similar symptoms currently where she works. She states two family members tested positive for the flu. Two other family members tested positive for covid. She has taken Zofran  today which slowed down the vomiting. She has taken Alka Seltzer Cold and Flu.  Cough This is a new problem. The current episode started today. The problem has been gradually worsening. The cough is Productive of sputum. Associated symptoms include a fever, headaches, myalgias, nasal congestion, rhinorrhea and a sore throat. Nothing aggravates the symptoms. She has tried rest for the symptoms. The treatment provided mild relief.     Review of Systems  Constitutional:  Positive for fever.  HENT:  Positive for rhinorrhea and sore throat.   Eyes: Negative.   Respiratory:  Positive for cough.   Cardiovascular: Negative.   Gastrointestinal:  Positive for nausea and vomiting.  Genitourinary: Negative.   Musculoskeletal:  Positive for myalgias.  Skin: Negative.   Neurological:  Positive for headaches.  Endo/Heme/Allergies: Negative.   Psychiatric/Behavioral: Negative.          Objective:    BP 124/80   Pulse 80   Temp 98.1 F (36.7 C)   Resp 18   Ht 5' 1 (1.549 m)   Wt 185 lb 4 oz (84 kg)   SpO2 95%   BMI 35.00 kg/m    Physical Exam Constitutional:      Appearance: Normal appearance.  HENT:     Right Ear: There is impacted cerumen. Tympanic membrane is erythematous.     Left Ear: There is impacted cerumen.     Mouth/Throat:     Mouth: Mucous membranes are  moist.  Cardiovascular:     Rate and Rhythm: Normal rate and regular rhythm.     Pulses: Normal pulses.     Heart sounds: Normal heart sounds.  Pulmonary:     Effort: Pulmonary effort is normal.     Breath sounds: Normal breath sounds.  Abdominal:     General: Abdomen is flat.  Musculoskeletal:        General: Normal range of motion.     Cervical back: Normal range of motion.  Skin:    General: Skin is warm and dry.  Neurological:     General: No focal deficit present.     Mental Status: She is alert and oriented to person, place, and time.  Psychiatric:        Mood and Affect: Mood normal.        Behavior: Behavior normal.        Thought Content: Thought content normal.        Judgment: Judgment normal.     No results found for any visits on 06/07/24.      Assessment & Plan:   Problem List Items Addressed This Visit       Respiratory   Sinusitis - Primary   Relevant Medications   amoxicillin  (AMOXIL ) 875 MG tablet     Nervous and Auditory   Ear infection   Start Amoxicillin  twice a  day for five days. Increase fluids and rest until feeling better. Wipe down everything with Lysol wipes. Try to wipe face after being outdoor to remove allergens near face      Relevant Medications   amoxicillin  (AMOXIL ) 875 MG tablet    No orders of the defined types were placed in this encounter.   Return if symptoms worsen or fail to improve. May stay out of work for two days.   Austine Cork, FNP   "

## 2024-06-15 ENCOUNTER — Encounter: Payer: Self-pay | Admitting: Internal Medicine

## 2024-06-15 ENCOUNTER — Ambulatory Visit: Admitting: Internal Medicine

## 2024-06-15 VITALS — BP 126/80 | HR 74 | Temp 97.7°F | Resp 18 | Ht 61.0 in | Wt 186.0 lb

## 2024-06-15 DIAGNOSIS — R0989 Other specified symptoms and signs involving the circulatory and respiratory systems: Secondary | ICD-10-CM | POA: Diagnosis not present

## 2024-06-15 DIAGNOSIS — K529 Noninfective gastroenteritis and colitis, unspecified: Secondary | ICD-10-CM

## 2024-06-15 LAB — POC COVID19 BINAXNOW: SARS Coronavirus 2 Ag: NEGATIVE

## 2024-06-15 MED ORDER — ONDANSETRON HCL 4 MG PO TABS: 4.0000 mg | ORAL_TABLET | Freq: Two times a day (BID) | ORAL | 1 refills | Status: AC

## 2024-06-15 MED ORDER — LOPERAMIDE HCL 2 MG PO TABS: 2.0000 mg | ORAL_TABLET | Freq: Four times a day (QID) | ORAL | 0 refills | Status: DC | PRN

## 2024-06-15 NOTE — Progress Notes (Signed)
" ° °  Office Visit  Subjective   Patient ID: Elizabeth Montoya   DOB: 1976/01/14   Age: 49 y.o.   MRN: [id number]   Chief Complaint Chief Complaint  Patient presents with   Office visit    Congestion, vomitting and diarrhea     History of Present Illness   49 years old female who is here complaining of nausea and vomiting since yesterday.  She says that she is not sure what she ate but she has watery diarrhea 5 times yesterday and threw up many times.  She is feeling nauseous today but no diarrhea.  She is complaining of diffuse abdominal pain.  She says that she has a history of diverticulitis.  And thing that abdominal pain got worse because of frequent vomiting.    She says that she has finish antibiotic on Friday for left ear infection but she still has a stuffy nose.  She is using nasal spray.  She wants her left ear to be checked.  Past Medical History Past Medical History:  Diagnosis Date   Anxiety    Constipation, chronic    Fatigue    GERD (gastroesophageal reflux disease)    Hormone replacement therapy    Menopausal and postmenopausal disorder 02/12/2021   Menopause 02/12/2021   Mild obstructive sleep apnea 04/17/2021   Mixed dyslipidemia 02/14/2021   Mood swings    Obesity (BMI 30-39.9) 02/06/2021   Overweight    Persistent indigestion    Primary hypertension 02/06/2021   Seasonal allergies    TMJ arthralgia      Allergies Allergies[1]   Review of Systems Review of Systems  HENT:  Positive for congestion.   Gastrointestinal:  Positive for abdominal pain, diarrhea, nausea and vomiting.       Objective:    Vitals BP 126/80   Pulse 74   Temp 97.7 F (36.5 C)   Resp 18   Ht 5' 1 (1.549 m)   Wt 186 lb (84.4 kg)   SpO2 97%   BMI 35.14 kg/m    Physical Examination Physical Exam Constitutional:      Appearance: Normal appearance.  Cardiovascular:     Rate and Rhythm: Normal rate and regular rhythm.     Heart sounds: Normal heart sounds.  Abdominal:      Palpations: Abdomen is soft.     Tenderness: There is abdominal tenderness.     Comments: Diffusely tender  Neurological:     Mental Status: She is alert.        Assessment & Plan:   Gastroenteritis   She will take Zofran  tablet every 6 hour for nausea and vomiting.  She will eat soft food.  She will take Imodium  for diarrhea and drink 1 glass of Gatorade after each loose stool.  She will wash her hand.  He she is not better then she will call.    No follow-ups on file.   Roetta Dare, MD      [1]  Allergies Allergen Reactions   Codeine Other (See Comments)   "

## 2024-06-15 NOTE — Assessment & Plan Note (Signed)
"    She will take Zofran  tablet every 6 hour for nausea and vomiting.  She will eat soft food.  She will take Imodium  for diarrhea and drink 1 glass of Gatorade after each loose stool.  She will wash her hand.  He she is not better then she will call. "

## 2024-06-15 NOTE — Addendum Note (Signed)
 Addended by: Haylie Mccutcheon on: 06/15/2024 01:06 PM   Modules accepted: Orders

## 2024-06-20 ENCOUNTER — Other Ambulatory Visit: Payer: Self-pay | Admitting: Internal Medicine

## 2024-06-20 MED ORDER — TRAMADOL HCL 50 MG PO TABS: 50.0000 mg | ORAL_TABLET | Freq: Two times a day (BID) | ORAL | 1 refills | Status: AC | PRN

## 2024-06-26 ENCOUNTER — Other Ambulatory Visit: Payer: Self-pay | Admitting: Internal Medicine

## 2024-06-26 MED ORDER — BUPROPION HCL ER (XL) 300 MG PO TB24: 300.0000 mg | ORAL_TABLET | Freq: Every day | ORAL | 0 refills | Status: AC

## 2024-06-29 ENCOUNTER — Ambulatory Visit

## 2024-06-29 VITALS — BP 130/90 | HR 79 | Temp 98.1°F | Resp 18 | Ht 61.0 in | Wt 186.2 lb

## 2024-06-29 DIAGNOSIS — E669 Obesity, unspecified: Secondary | ICD-10-CM

## 2024-06-29 DIAGNOSIS — F419 Anxiety disorder, unspecified: Secondary | ICD-10-CM

## 2024-06-29 MED ORDER — SEMAGLUTIDE-WEIGHT MANAGEMENT 2.4 MG/0.75ML ~~LOC~~ SOAJ: 2.4000 mg | SUBCUTANEOUS | 0 refills | Status: DC

## 2024-06-29 MED ORDER — BUSPIRONE HCL 15 MG PO TABS: 15.0000 mg | ORAL_TABLET | Freq: Two times a day (BID) | ORAL | 2 refills | Status: AC

## 2024-07-04 ENCOUNTER — Other Ambulatory Visit: Payer: Self-pay

## 2024-07-04 DIAGNOSIS — E669 Obesity, unspecified: Secondary | ICD-10-CM

## 2024-07-04 MED ORDER — SEMAGLUTIDE-WEIGHT MANAGEMENT 2.4 MG/0.75ML ~~LOC~~ SOAJ: 2.4000 mg | SUBCUTANEOUS | 0 refills | Status: DC

## 2024-07-04 NOTE — Assessment & Plan Note (Addendum)
 Patient seeking pharmacologic assistance with weight loss. Previously tried phentermine and semaglutide . Candidate for Wegovy  (semaglutide ) therapy. Encourage lifestyle modifications: diet, exercise, and behavior modification for weight loss. Monitor blood pressure and overall tolerance to Wegovy .

## 2024-07-04 NOTE — Progress Notes (Signed)
 Patient Name: Elizabeth Montoya DOB: [birth year] Age: 49 Gender: Female Date of Service: 06/29/2024 Allergies: Codeine  Chief Complaint: Patient presents for acute visit regarding weight loss management.  History of Present Illness (HPI): Alece Kimbler is a 49 year old female who presents today to discuss weight loss options. She reports prior use of phentermine but refuses to use it again. She also reports previous treatment with semaglutide  at [facility] in [place] but discontinued due to cost. She denies any nausea, vomiting, diarrhea, or constipation with prior treatments. She denies chest pain, palpitations, shortness of breath, edema, or other acute concerns. She denies a history of cardiac disease, uncontrolled hypertension, glaucoma, or substance abuse. She is motivated to pursue weight loss therapy. She has no current complaints of pain.  Past Medical History: Obesity (BMI 35.19) Medications: Buspirone  (Buspar ), continued Allergies: Codeine (reaction not specified)  Review of Systems (ROS): Constitutional: No fever, chills, or fatigue. Cardiovascular: Denies chest pain, palpitations, edema, syncope. Respiratory: Denies shortness of breath, cough, wheezing. Gastrointestinal: Denies nausea, vomiting, diarrhea, constipation, abdominal pain. Musculoskeletal: Denies joint pain, swelling, or weakness. Neurological: Denies headaches, dizziness, numbness, or tingling. Psychiatric: Denies depression, anxiety (Buspar  prescribed). Allergic/Immunologic: No new allergies or immune issues.  Vital Signs: BP: 130/88 mmHg HR: 79 bpm Temp: 98.107F RR: 18 SpO?: 99% on room air Weight: 186 lb 4 oz Height: 5'1 BMI: 35.19  Physical Examination: General: Well-appearing, alert, in no acute distress. Cardiovascular: Regular rate and rhythm, no murmurs, rubs, or gallops. Peripheral pulses 2+ bilaterally. Respiratory: Lungs clear to auscultation bilaterally, no wheezes, rales, or  rhonchi. Abdomen: Soft, non-tender, non-distended, no hepatosplenomegaly, normoactive bowel sounds. Musculoskeletal: Normal range of motion, no edema, no joint deformities. Neurological: Alert and oriented 3 Psychiatric: Appropriate mood and affect, normal judgment and insight.  Assessment: Obesity (BMI 35.19) - Patient seeking pharmacologic assistance with weight loss. Previously tried phentermine and semaglutide . Candidate for Wegovy  (semaglutide ) therapy. Anxiety disorder - Managed with Buspar ; medication refilled.  Plan: Wegovy  (semaglutide ) 2.5 mg - prescribed and sent to [facility] on [facility]. Patient counseled on proper use, potential side effects (nausea, vomiting, diarrhea, constipation, risk of hypoglycemia in diabetic patients). Buspar  (buspirone ) - medication reordered as previously prescribed. Encourage lifestyle modifications: diet, exercise, and behavior modification for weight loss. Monitor blood pressure and overall tolerance to Wegovy . Follow-up visit in 4 weeks to assess weight loss progress, medication tolerance, and adjust therapy as needed. Educate patient on when to seek urgent care (e.g., severe abdominal pain, persistent vomiting, signs of pancreatitis, or allergic reactions).  Jon Bihari, NP

## 2024-07-05 ENCOUNTER — Other Ambulatory Visit: Payer: Self-pay

## 2024-07-05 ENCOUNTER — Ambulatory Visit

## 2024-07-05 VITALS — BP 130/84 | HR 67 | Temp 97.9°F | Resp 16 | Ht 61.0 in | Wt 188.8 lb

## 2024-07-05 DIAGNOSIS — J329 Chronic sinusitis, unspecified: Secondary | ICD-10-CM

## 2024-07-05 DIAGNOSIS — H6690 Otitis media, unspecified, unspecified ear: Secondary | ICD-10-CM

## 2024-07-05 MED ORDER — PREDNISONE 2.5 MG PO TABS: 10.0000 mg | ORAL_TABLET | Freq: Every day | ORAL | 0 refills | Status: AC

## 2024-07-05 MED ORDER — AMOXICILLIN-POT CLAVULANATE 875-125 MG PO TABS: 1.0000 | ORAL_TABLET | Freq: Two times a day (BID) | ORAL | 0 refills | Status: AC

## 2024-07-06 NOTE — Progress Notes (Signed)
 Patient: Elizabeth Montoya DOB: January 01, 1976 Age: 49 Date of Visit: 07/05/2024  S - Subjective Chief Complaint: Green drainage from nose and eyes with left ear pain. History of Present Illness: Elizabeth Montoya is a 49 year old female presenting with persistent upper respiratory and otologic symptoms lasting greater than 10 days. She reports green purulent nasal discharge, eye drainage, and left ear pain rated 7/10. She denies sinus pressure or facial pain but reports a history of recurrent sinus infections.  She was previously seen on 06/07/2024 and completed amoxicillin  BID for 5 days, with incomplete resolution of symptoms. She continues to experience left otalgia. She states that Augmentin  combined with prednisone  has been effective during past episodes and requests this regimen.  She denies fever, chills, headache, dizziness, hearing loss, vertigo, nausea, vomiting, cough, or dyspnea.  In-office testing: Influenza: Negative COVID-19: Negative Medications: Reviewed per chart Allergies: Codeine  Review of Systems Constitutional: Denies fever, chills, fatigue HEENT: Positive for green nasal drainage, eye discharge, and left ear pain; denies sinus pressure or sore throat Eyes: Positive for discharge; denies vision changes Respiratory: Denies cough, shortness of breath Cardiovascular: Denies chest pain, palpitations GI: Denies nausea, vomiting, diarrhea Neurologic: Denies headache, dizziness Skin: Denies rash  O - Objective Vital Signs (07/05/2024): BP: 130/84 mmHg Pulse: 67 bpm Temp: 97.14F (temporal) Resp: 16/min SpO?: 99% on room air Weight: 188 lb 12 oz Height: 5'1 Pain Score: 7/10 - left ear Physical Examination: General: Alert, oriented 3, no acute distress Head: Normocephalic, atraumatic Eyes: Mild conjunctival injection with crusting discharge; no periorbital edema Ears: Left TM: Erythematous, bulging, decreased light reflex; consistent with acute otitis media Right  TM: Intact, no erythema Nose: Turbinates erythematous and edematous with purulent green discharge Sinuses: No maxillary or frontal sinus tenderness Respiratory: Lungs clear to auscultation bilaterally Cardiovascular: Regular rate and rhythm, no murmurs Skin: Warm, dry, intact  A - Assessment Acute left otitis media Acute bacterial rhinosinusitis - symptoms >10 days with purulent drainage Recurrent sinus disease  P - Plan Medications: Amoxicillin -clavulanate (Augmentin ) PO BID  10 days Prednisone  - short oral course as requested and previously tolerated Supportive care: saline nasal spray, increased oral fluids Patient Education: Explained diagnosis of left otitis media and bacterial sinus infection Emphasized importance of completing the full antibiotic course Reviewed prednisone  use and potential side effects (insomnia, mood changes, increased appetite) Advised against unnecessary decongestant overuse Reviewed signs of worsening infection Red Flags / Return Precautions: Worsening ear pain, fever, hearing loss, facial swelling, severe headache, or no improvement within 3-5 days Follow-Up: Follow up if symptoms persist or worsen Consider ENT referral for recurrent sinus infections  Jon Bihari, NP

## 2024-07-06 NOTE — Assessment & Plan Note (Signed)
 Left TM: Erythematous, bulging, decreased light reflex; consistent with acute otitis media Amoxicillin -clavulanate (Augmentin ) PO BID  10 days

## 2024-07-06 NOTE — Assessment & Plan Note (Signed)
 Amoxicillin -clavulanate (Augmentin ) PO BID  10 days Prednisone  - short oral course as requested and previously tolerated Supportive care: saline nasal spray, increased oral fluids

## 2024-07-27 ENCOUNTER — Ambulatory Visit

## 2024-08-17 ENCOUNTER — Ambulatory Visit: Admitting: Internal Medicine
